# Patient Record
Sex: Female | Born: 1960 | Race: Black or African American | Hispanic: No | Marital: Married | Smoking: Never smoker
Health system: Southern US, Community
[De-identification: ages and names within clinical notes are randomized; demographics above are authoritative.]

## PROBLEM LIST (undated history)

## (undated) DIAGNOSIS — J309 Allergic rhinitis, unspecified: Secondary | ICD-10-CM

## (undated) DIAGNOSIS — I10 Essential (primary) hypertension: Secondary | ICD-10-CM

## (undated) DIAGNOSIS — F32A Depression, unspecified: Secondary | ICD-10-CM

## (undated) DIAGNOSIS — E785 Hyperlipidemia, unspecified: Secondary | ICD-10-CM

## (undated) DIAGNOSIS — Z8669 Personal history of other diseases of the nervous system and sense organs: Secondary | ICD-10-CM

## (undated) DIAGNOSIS — E119 Type 2 diabetes mellitus without complications: Secondary | ICD-10-CM

## (undated) DIAGNOSIS — K219 Gastro-esophageal reflux disease without esophagitis: Secondary | ICD-10-CM

## (undated) HISTORY — PX: ABDOMINAL HYSTERECTOMY: SHX81

## (undated) HISTORY — PX: TUBAL LIGATION: SHX77

## (undated) HISTORY — DX: Allergic rhinitis, unspecified: J30.9

## (undated) HISTORY — PX: ECTOPIC PREGNANCY SURGERY: SHX613

## (undated) HISTORY — DX: Gastro-esophageal reflux disease without esophagitis: K21.9

## (undated) HISTORY — DX: Depression, unspecified: F32.A

## (undated) HISTORY — DX: Personal history of other diseases of the nervous system and sense organs: Z86.69

## (undated) HISTORY — DX: Hyperlipidemia, unspecified: E78.5

---

## 1998-11-28 ENCOUNTER — Emergency Department (HOSPITAL_COMMUNITY): Admission: EM | Admit: 1998-11-28 | Discharge: 1998-11-28 | Payer: Self-pay | Admitting: Emergency Medicine

## 1998-11-28 ENCOUNTER — Encounter: Payer: Self-pay | Admitting: Emergency Medicine

## 1999-05-11 ENCOUNTER — Emergency Department (HOSPITAL_COMMUNITY): Admission: EM | Admit: 1999-05-11 | Discharge: 1999-05-11 | Payer: Self-pay | Admitting: Emergency Medicine

## 1999-08-03 ENCOUNTER — Emergency Department (HOSPITAL_COMMUNITY): Admission: EM | Admit: 1999-08-03 | Discharge: 1999-08-03 | Payer: Self-pay | Admitting: Emergency Medicine

## 1999-08-03 ENCOUNTER — Encounter: Payer: Self-pay | Admitting: Emergency Medicine

## 1999-12-24 ENCOUNTER — Emergency Department (HOSPITAL_COMMUNITY): Admission: EM | Admit: 1999-12-24 | Discharge: 1999-12-24 | Payer: Self-pay | Admitting: Emergency Medicine

## 2000-02-14 ENCOUNTER — Emergency Department (HOSPITAL_COMMUNITY): Admission: EM | Admit: 2000-02-14 | Discharge: 2000-02-14 | Payer: Self-pay | Admitting: Emergency Medicine

## 2000-07-14 ENCOUNTER — Emergency Department (HOSPITAL_COMMUNITY): Admission: EM | Admit: 2000-07-14 | Discharge: 2000-07-14 | Payer: Self-pay | Admitting: Emergency Medicine

## 2000-07-14 ENCOUNTER — Encounter: Payer: Self-pay | Admitting: Emergency Medicine

## 2000-07-22 ENCOUNTER — Encounter: Admission: RE | Admit: 2000-07-22 | Discharge: 2000-08-03 | Payer: Self-pay | Admitting: Orthopaedic Surgery

## 2000-08-17 ENCOUNTER — Encounter: Admission: RE | Admit: 2000-08-17 | Discharge: 2000-09-15 | Payer: Self-pay | Admitting: Orthopaedic Surgery

## 2000-08-21 ENCOUNTER — Encounter: Payer: Self-pay | Admitting: Emergency Medicine

## 2000-08-21 ENCOUNTER — Emergency Department (HOSPITAL_COMMUNITY): Admission: EM | Admit: 2000-08-21 | Discharge: 2000-08-21 | Payer: Self-pay | Admitting: Emergency Medicine

## 2001-10-18 ENCOUNTER — Emergency Department (HOSPITAL_COMMUNITY): Admission: EM | Admit: 2001-10-18 | Discharge: 2001-10-18 | Payer: Self-pay

## 2001-10-20 ENCOUNTER — Emergency Department (HOSPITAL_COMMUNITY): Admission: EM | Admit: 2001-10-20 | Discharge: 2001-10-20 | Payer: Self-pay | Admitting: Emergency Medicine

## 2002-05-10 ENCOUNTER — Emergency Department (HOSPITAL_COMMUNITY): Admission: EM | Admit: 2002-05-10 | Discharge: 2002-05-10 | Payer: Self-pay | Admitting: Emergency Medicine

## 2002-05-10 ENCOUNTER — Encounter: Payer: Self-pay | Admitting: *Deleted

## 2002-10-10 ENCOUNTER — Encounter: Payer: Self-pay | Admitting: Emergency Medicine

## 2002-10-10 ENCOUNTER — Emergency Department (HOSPITAL_COMMUNITY): Admission: EM | Admit: 2002-10-10 | Discharge: 2002-10-10 | Payer: Self-pay | Admitting: Emergency Medicine

## 2002-12-06 ENCOUNTER — Encounter: Admission: RE | Admit: 2002-12-06 | Discharge: 2002-12-06 | Payer: Self-pay | Admitting: Family Medicine

## 2002-12-06 ENCOUNTER — Encounter: Payer: Self-pay | Admitting: Family Medicine

## 2003-02-28 ENCOUNTER — Ambulatory Visit (HOSPITAL_COMMUNITY): Admission: RE | Admit: 2003-02-28 | Discharge: 2003-02-28 | Payer: Self-pay | Admitting: Family Medicine

## 2003-02-28 ENCOUNTER — Encounter: Payer: Self-pay | Admitting: Family Medicine

## 2003-05-28 ENCOUNTER — Emergency Department (HOSPITAL_COMMUNITY): Admission: EM | Admit: 2003-05-28 | Discharge: 2003-05-28 | Payer: Self-pay | Admitting: Emergency Medicine

## 2003-07-21 ENCOUNTER — Emergency Department (HOSPITAL_COMMUNITY): Admission: EM | Admit: 2003-07-21 | Discharge: 2003-07-21 | Payer: Self-pay | Admitting: Emergency Medicine

## 2003-10-25 ENCOUNTER — Emergency Department (HOSPITAL_COMMUNITY): Admission: EM | Admit: 2003-10-25 | Discharge: 2003-10-25 | Payer: Self-pay | Admitting: *Deleted

## 2003-12-04 ENCOUNTER — Emergency Department (HOSPITAL_COMMUNITY): Admission: EM | Admit: 2003-12-04 | Discharge: 2003-12-04 | Payer: Self-pay | Admitting: Emergency Medicine

## 2003-12-24 ENCOUNTER — Emergency Department (HOSPITAL_COMMUNITY): Admission: EM | Admit: 2003-12-24 | Discharge: 2003-12-24 | Payer: Self-pay | Admitting: *Deleted

## 2003-12-26 ENCOUNTER — Emergency Department (HOSPITAL_COMMUNITY): Admission: EM | Admit: 2003-12-26 | Discharge: 2003-12-26 | Payer: Self-pay | Admitting: *Deleted

## 2003-12-27 ENCOUNTER — Inpatient Hospital Stay (HOSPITAL_COMMUNITY): Admission: EM | Admit: 2003-12-27 | Discharge: 2003-12-29 | Payer: Self-pay | Admitting: Emergency Medicine

## 2003-12-28 ENCOUNTER — Encounter (INDEPENDENT_AMBULATORY_CARE_PROVIDER_SITE_OTHER): Payer: Self-pay | Admitting: Cardiology

## 2004-01-01 ENCOUNTER — Emergency Department (HOSPITAL_COMMUNITY): Admission: EM | Admit: 2004-01-01 | Discharge: 2004-01-01 | Payer: Self-pay | Admitting: Family Medicine

## 2004-01-05 ENCOUNTER — Emergency Department (HOSPITAL_COMMUNITY): Admission: EM | Admit: 2004-01-05 | Discharge: 2004-01-05 | Payer: Self-pay | Admitting: Emergency Medicine

## 2004-01-17 ENCOUNTER — Emergency Department (HOSPITAL_COMMUNITY): Admission: EM | Admit: 2004-01-17 | Discharge: 2004-01-18 | Payer: Self-pay | Admitting: Emergency Medicine

## 2004-01-25 ENCOUNTER — Encounter (INDEPENDENT_AMBULATORY_CARE_PROVIDER_SITE_OTHER): Payer: Self-pay | Admitting: Cardiology

## 2004-01-25 ENCOUNTER — Ambulatory Visit (HOSPITAL_COMMUNITY): Admission: RE | Admit: 2004-01-25 | Discharge: 2004-01-25 | Payer: Self-pay | Admitting: Family Medicine

## 2004-04-14 ENCOUNTER — Emergency Department (HOSPITAL_COMMUNITY): Admission: EM | Admit: 2004-04-14 | Discharge: 2004-04-14 | Payer: Self-pay | Admitting: Emergency Medicine

## 2004-07-12 ENCOUNTER — Ambulatory Visit (HOSPITAL_COMMUNITY): Admission: RE | Admit: 2004-07-12 | Discharge: 2004-07-12 | Payer: Self-pay | Admitting: Obstetrics

## 2004-07-17 ENCOUNTER — Ambulatory Visit (HOSPITAL_COMMUNITY): Admission: RE | Admit: 2004-07-17 | Discharge: 2004-07-17 | Payer: Self-pay | Admitting: Obstetrics

## 2004-07-17 ENCOUNTER — Encounter (INDEPENDENT_AMBULATORY_CARE_PROVIDER_SITE_OTHER): Payer: Self-pay | Admitting: *Deleted

## 2004-07-21 ENCOUNTER — Emergency Department (HOSPITAL_COMMUNITY): Admission: EM | Admit: 2004-07-21 | Discharge: 2004-07-21 | Payer: Self-pay | Admitting: Emergency Medicine

## 2004-09-24 ENCOUNTER — Emergency Department (HOSPITAL_COMMUNITY): Admission: EM | Admit: 2004-09-24 | Discharge: 2004-09-24 | Payer: Self-pay | Admitting: Emergency Medicine

## 2004-10-07 HISTORY — PX: ABDOMINAL HYSTERECTOMY: SHX81

## 2005-05-05 ENCOUNTER — Inpatient Hospital Stay (HOSPITAL_COMMUNITY): Admission: EM | Admit: 2005-05-05 | Discharge: 2005-05-07 | Payer: Self-pay | Admitting: Emergency Medicine

## 2005-09-17 ENCOUNTER — Ambulatory Visit (HOSPITAL_COMMUNITY): Admission: RE | Admit: 2005-09-17 | Discharge: 2005-09-17 | Payer: Self-pay | Admitting: Obstetrics

## 2005-09-24 ENCOUNTER — Encounter (INDEPENDENT_AMBULATORY_CARE_PROVIDER_SITE_OTHER): Payer: Self-pay | Admitting: Specialist

## 2005-09-24 ENCOUNTER — Inpatient Hospital Stay (HOSPITAL_COMMUNITY): Admission: RE | Admit: 2005-09-24 | Discharge: 2005-09-26 | Payer: Self-pay | Admitting: Obstetrics

## 2005-10-08 ENCOUNTER — Ambulatory Visit (HOSPITAL_COMMUNITY): Admission: RE | Admit: 2005-10-08 | Discharge: 2005-10-08 | Payer: Self-pay | Admitting: Obstetrics

## 2006-08-12 ENCOUNTER — Ambulatory Visit (HOSPITAL_COMMUNITY): Admission: RE | Admit: 2006-08-12 | Discharge: 2006-08-12 | Payer: Self-pay | Admitting: Family Medicine

## 2006-11-24 IMAGING — US US TRANSVAGINAL NON-OB
1 series · 14 of 25 positions shown · non-contrast
Comparison: 07/12/2004.

CLINICAL DATA: Abdominal uterine bleeding.  
 TRANSABDOMINAL AND TRANSVAGINAL PELVIC ULTRASOUND:
TECHNIQUE: Both transabdominal and transvaginal ultrasound examinations of the pelvis were performed including evaluation of the uterus, ovaries, adnexal regions, and pelvic cul-de-sac.

[Series 1: us transvaginal non-ob · 0.35mm/px · 14 of 60 slices shown]
[im 1/60]
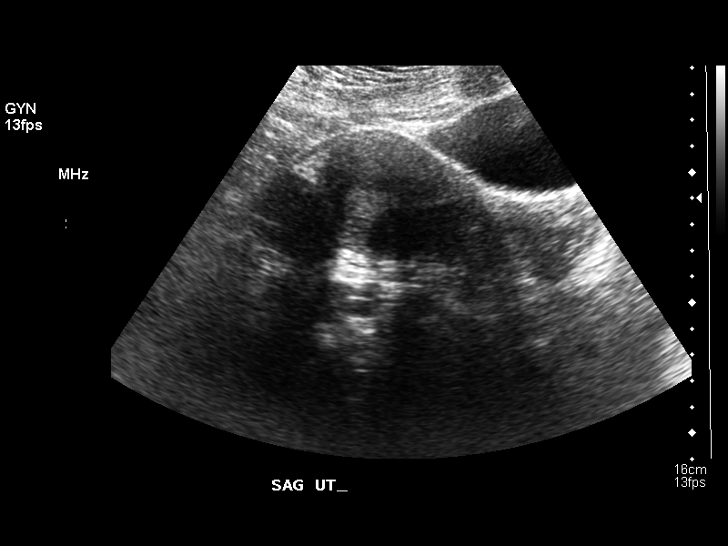
[im 5/60]
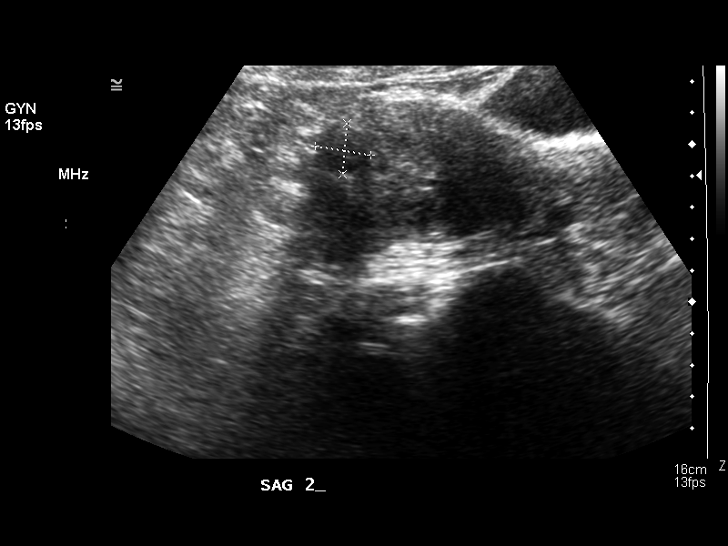
[im 10/60]
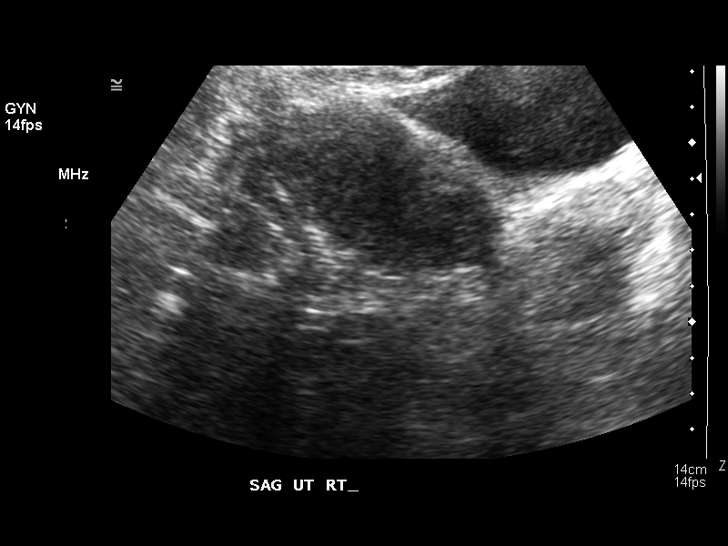
[im 15/60]
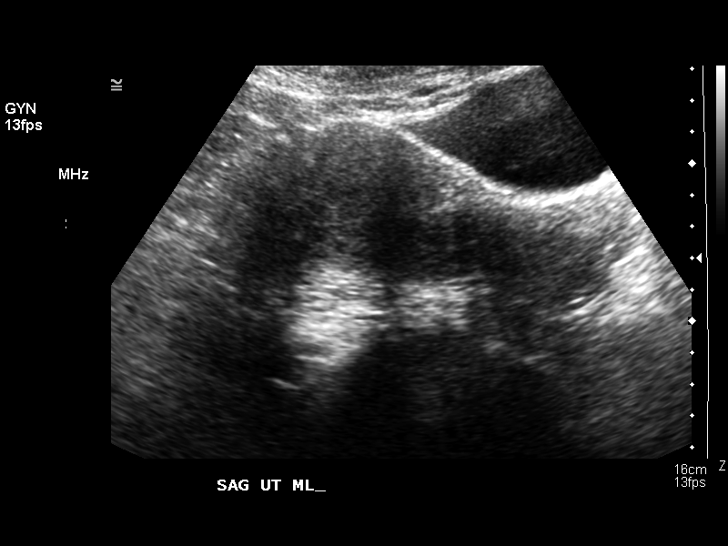
[im 20/60]
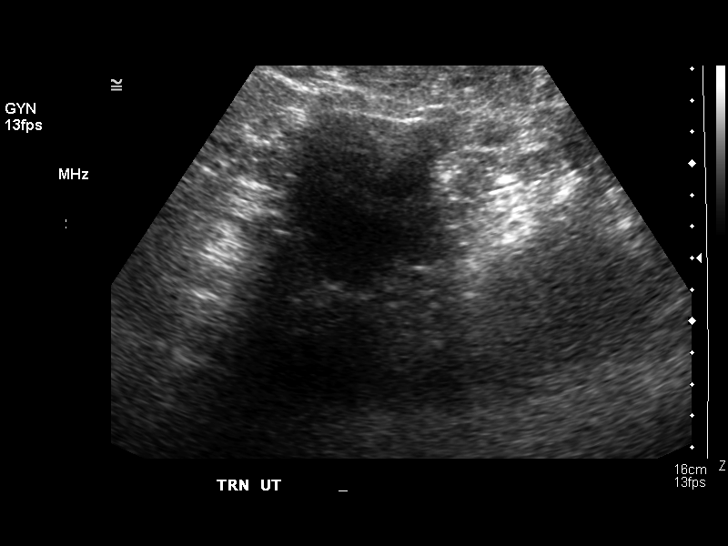
[im 23/60]
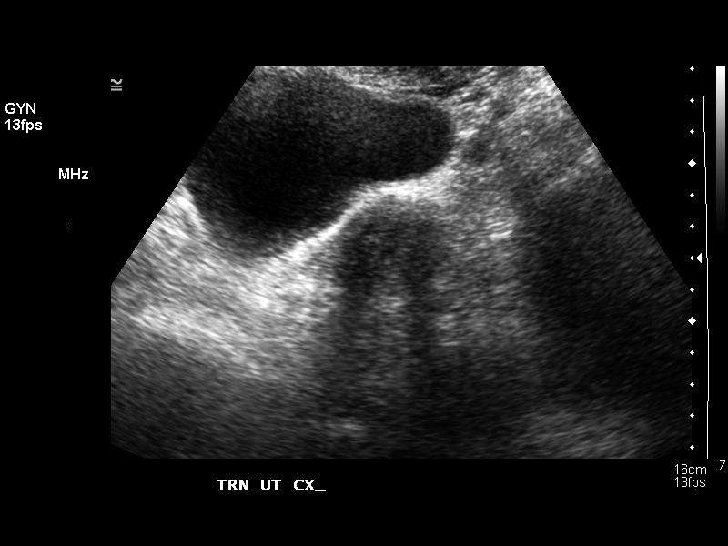
[im 28/60]
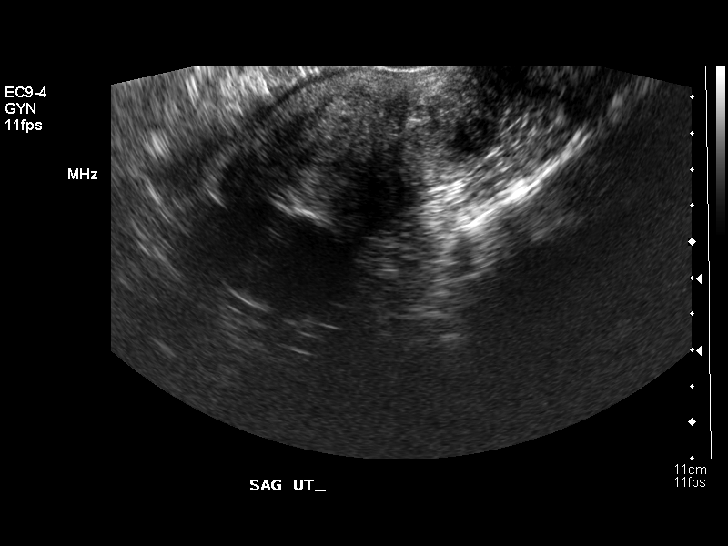
[im 32/60]
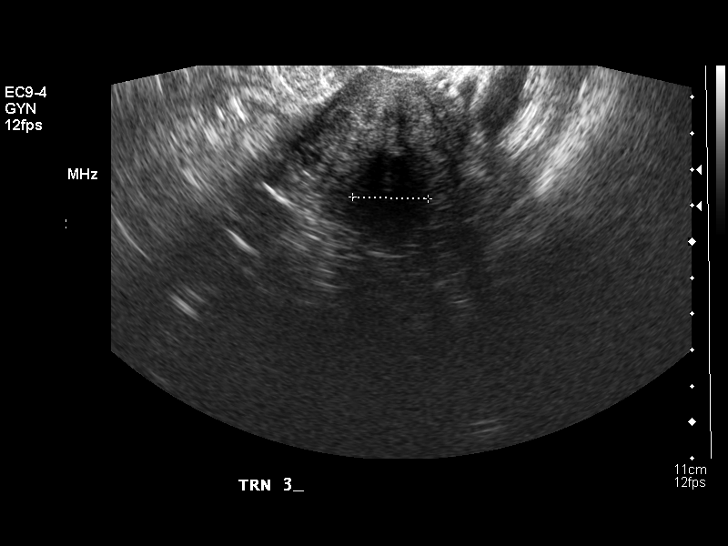
[im 37/60]
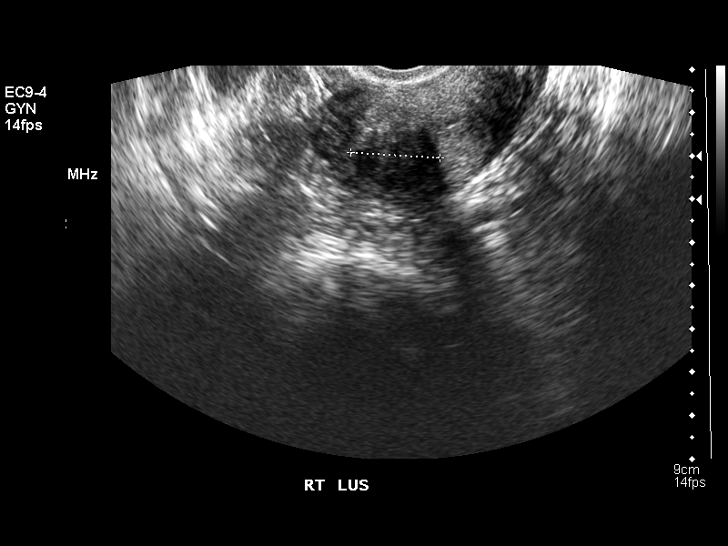
[im 40/60]
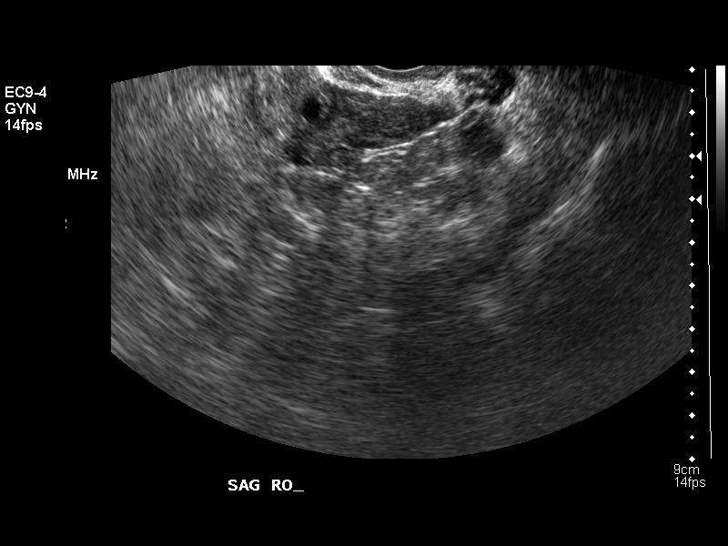
[im 45/60]
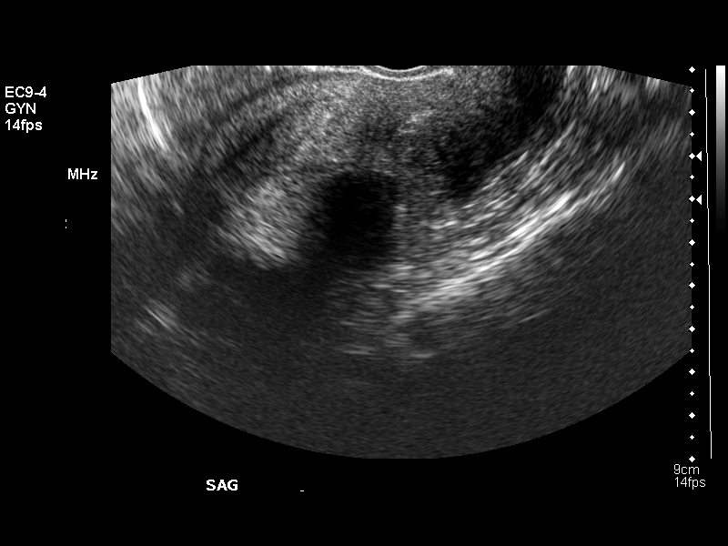
[im 50/60]
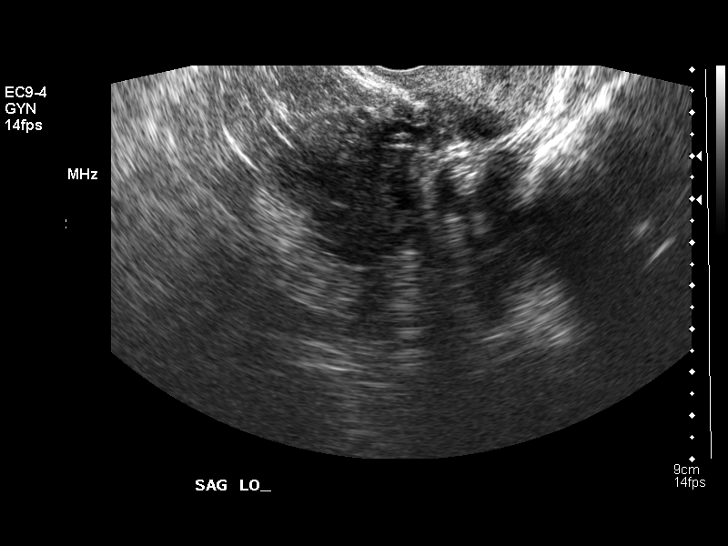
[im 55/60]
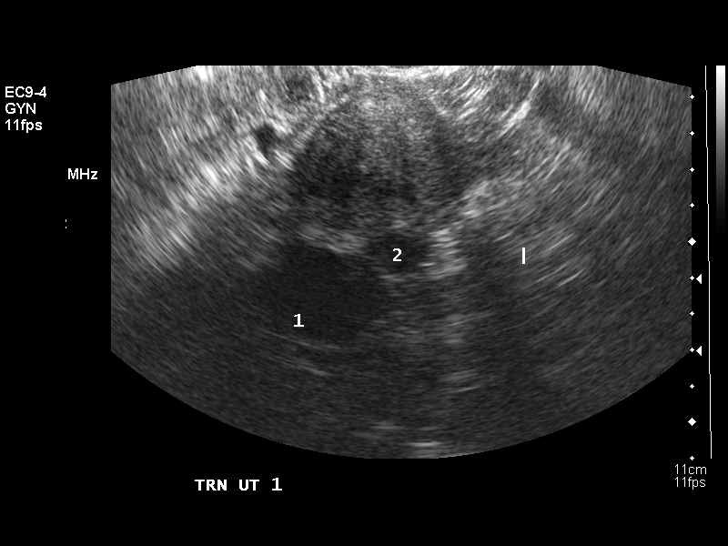
[im 60/60]
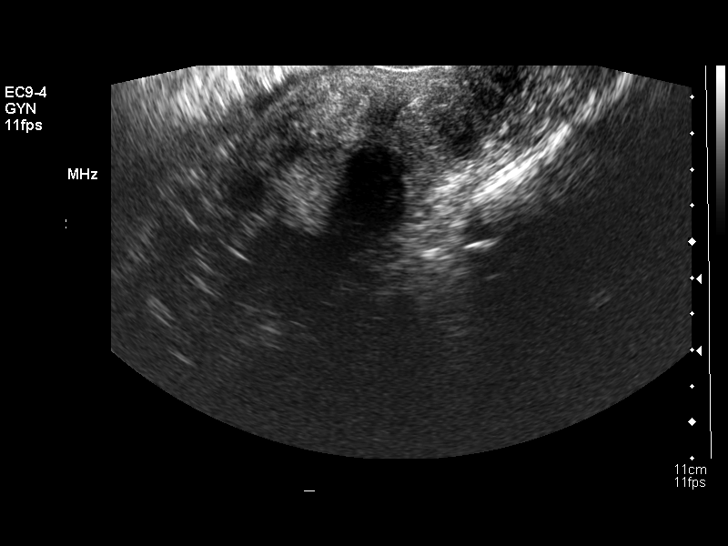

[14 of 25 positions shown; findings below may reference images not displayed]

The uterus is enlarged, measuring approximately 12.2 cm in length x 4.9 cm in AP diameter x 6.6 cm in transverse diameter.  Four fibroids are seen all located within the posterior uterine body and fundus.  These range in size from 2.1 cm to 3.7 cm in greatest diameter.  These are mildly increased in both size and number compared with prior study.  Maximum endometrial thickness measures 8 mm on transvaginal sonography. 
 Both ovaries are visualized and are normal in appearance.  No adnexal masses are identified and there is no evidence of free fluid.
IMPRESSION: 1.  Four posterior uterine fibroids ranging in size from 2.1 cm to 3.6 cm.  These have increased in number and size compared with prior study of 07/12/2004.  
 2.  Normal ovaries.

## 2006-12-09 ENCOUNTER — Ambulatory Visit: Payer: Self-pay | Admitting: Internal Medicine

## 2007-02-10 ENCOUNTER — Encounter: Admission: RE | Admit: 2007-02-10 | Discharge: 2007-02-10 | Payer: Self-pay | Admitting: Family Medicine

## 2007-03-03 ENCOUNTER — Ambulatory Visit: Payer: Self-pay | Admitting: Gastroenterology

## 2007-05-27 ENCOUNTER — Emergency Department (HOSPITAL_COMMUNITY): Admission: EM | Admit: 2007-05-27 | Discharge: 2007-05-27 | Payer: Self-pay | Admitting: Emergency Medicine

## 2007-07-01 ENCOUNTER — Ambulatory Visit (HOSPITAL_COMMUNITY): Payer: Self-pay | Admitting: Psychiatry

## 2007-07-28 ENCOUNTER — Ambulatory Visit (HOSPITAL_COMMUNITY): Payer: Self-pay | Admitting: Psychiatry

## 2007-09-01 ENCOUNTER — Ambulatory Visit (HOSPITAL_COMMUNITY): Payer: Self-pay | Admitting: Psychiatry

## 2007-09-02 ENCOUNTER — Emergency Department (HOSPITAL_COMMUNITY): Admission: EM | Admit: 2007-09-02 | Discharge: 2007-09-02 | Payer: Self-pay | Admitting: Emergency Medicine

## 2007-11-17 ENCOUNTER — Ambulatory Visit (HOSPITAL_COMMUNITY): Payer: Self-pay | Admitting: Psychiatry

## 2010-06-29 ENCOUNTER — Encounter: Payer: Self-pay | Admitting: Obstetrics

## 2010-06-30 ENCOUNTER — Encounter: Payer: Self-pay | Admitting: Obstetrics

## 2010-10-25 NOTE — Op Note (Signed)
NAME:  Sherry Cook, Sherry Cook NO.:  [id number]   MEDICAL RECORD NO.:  [id number]          PATIENT TYPE:  AMB   LOCATION:  SDC                           FACILITY:  [facility]   PHYSICIAN:  Kathreen Cosier, M.D.DATE OF BIRTH:  January 08, 1961   DATE OF PROCEDURE:  07/17/2004  DATE OF DISCHARGE:                                 OPERATIVE REPORT   PREOPERATIVE DIAGNOSES:  Dysfunctional uterine bleeding.   Under general anesthesia, perineum and vagina prepped and draped. The  bladder emptied with a straight catheter. Bimanual exam revealed the uterus  to be top normal size with small myomas.  Prior to the bimanual, the  Laminaria was removed from the cervix.  The anterior lip of the cervix  grasped with a tenaculum and the cervix curetted and a small amount of  tissue obtained.  The endometrial cavity sounded to 10 cm, endocervical  length measured to __________ cm giving a cavity length of 4 cm.  The cervix  was already noted to be dilated to a #25 Shawnie Pons. A diagnostic hysteroscope  was inserted. The cavity was normal and then sharp curettage performed. A  large amount of tissue obtained. The Novasure device was inserted in the  endometrial cavity and the cavity integrity test performed. The cavity width  was 2.5 cm.  Ablation was performed at 55 watts for 1.23 minutes and then  the hysteroscopy was repeated and the cavity was noted to be totally  ablated. Fluid deficit was 40 mL.  The patient tolerated the procedure well  and was taken to the recovery room in good condition.      BAM/MEDQ  D:  07/17/2004  T:  07/17/2004  Job:  [id number]

## 2010-10-25 NOTE — Op Note (Signed)
NAME:  Sherry Cook, Sherry Cook                ACCOUNT NO.:  [id number]   MEDICAL RECORD NO.:  [id number]          PATIENT TYPE:  INP   LOCATION:  9312                          FACILITY:  [facility]   PHYSICIAN:  Kathreen Cosier, M.D.DATE OF BIRTH:  June 30, 1960   DATE OF PROCEDURE:  09/24/2005  DATE OF DISCHARGE:                                 OPERATIVE REPORT   PREOPERATIVE DIAGNOSIS:  Myoma uteri, chronic pelvic pain.   POSTOPERATIVE DIAGNOSIS:   OPERATION PERFORMED:   SURGEON:  Kathreen Cosier, M.D.   ASSISTANT:  Charles A. Clearance Coots, M.D.   ANESTHESIA:  General.   DESCRIPTION OF PROCEDURE:  Patient placed on the operating table in the  supine position. General anesthesia administered by Dr. Jean Rosenthal. Abdomen  prepped and draped.  Bladder emptied with Foley catheter.  Transverse  suprapubic incision made through old scar and carried down to rectus fascia.  Fasciaincised the length of the incision.  Rectus muscles were retracted  laterally.  Peritoneum incised longitudinally.  She was noted to have  multiple myomas.  The ovaries were normal.  The right tube was absent from a  previous ectopic.  Right round ligament was grasped with a Kelly clamp, cut,  suture ligated with #1 chromic.  Procedure done in a similar fashion on the  other side.  Metzenbaum scissors were used to develop the bladder flap and  the bladder dissected off of the cervix.  The right utero-ovarian ligament  was grasped with a Kelly clamp, cut, suture ligated with a #1 chromic.  Procedure done in similar fashion to the other side.  Uterine vessels  skeletonized, double clamped with Heaney clamps on the right, cut and suture  ligated x 2 with #1 chromic.  Procedure done in similar fashion to the other  side.  The cardinal and uterosacral ligaments grasped with straight Kocher  clamp on the right, cut, suture ligated with #1 chromic, similar fashion to  the other side.  The uterus was removed at the cervicovaginal junction  with  Mayo scissors.  Modified Richardson's sutures placed in the angles of the  vagina and then the vaginal vault run with interlocking suture of #1  chromic.  Vault was left open.  Hemostasis was satisfactory.  The operative  site was reperitonealized with 2-0 chromic.  Lap and sponge counts correct.  Abdomen closed in layers.  Peritoneum continuous suture of 0 chromic, fascia  continuous suture of 0 Dexon, and the skin closed with subcuticular stitch  of 4-0 Monocryl.  Blood loss was less than 100 mL.  The patient tolerated  the procedure well, taken to recovery room in good condition.           ______________________________  Kathreen Cosier, M.D.     BAM/MEDQ  D:  09/24/2005  T:  09/25/2005  Job:  [id number]

## 2010-10-25 NOTE — H&P (Signed)
NAME:  Sherry Cook, Sherry Cook                          ACCOUNT NO.:  [id number]   MEDICAL RECORD NO.:  [id number]                   PATIENT TYPE:  INP   LOCATION:  3711                                 FACILITY:  [facility]   PHYSICIAN:  Hettie Holstein, D.O.                 DATE OF BIRTH:  1960-10-10   DATE OF ADMISSION:  12/27/2003  DATE OF DISCHARGE:                                HISTORY & PHYSICAL   HISTORY OF PRESENT ILLNESS:  This is a pleasant 49 year old African American  female with a history of hypertension on medications for the past two years  who has seen Dr. Ronne Binning just six weeks ago with complaints of generalized  fatigue and weight gain who, out of frustration, quit her medications and  has been in the emergency department three times over the past month, each  with poorly controlled hypertension.  Back on June 27, she had presenting  blood pressure of 188/122.  On July 17, she presented to the Urgent [facility] and had blood pressure 196/125.  Today, she presented with a blood  pressure of 178/112.  She states she has not taken her medications for the  past six weeks secondary to feeling that they were contributing to her poor  energy and feeling tired all the time.  Today, she had a headache, frontal  and posterior, that was throbbing.  Again, she was noted to have elevated  blood pressure.  The emergency department sent her to CT scan and there was  no evidence of acute bleed.  She was given small doses of Clonidine and  minimal doses of Labetalol without resolution of her hypertensive urgency.  She had no other focal complaints.  She had some blurred vision and nausea  associated with her headache and this was not relieved with narcotics in the  emergency department.  We were called down to evaluate Sherry Cook for medical  management of her hypertensive urgency.   PAST MEDICAL HISTORY:  Significant for hypertension over the past two years.  She has a history of diabetes  mellitus that has been quite labile.  She has  been on medications in the past, Glucovance, and has experienced  hypoglycemic episodes multiple times and has changed to Actos.  She has a  history of ectopic pregnancy in the past.  Tubal ligation.  She has two  children.  No prior history of heart disease.  No prior history of heart  evaluation.  No prior history of kidney or liver disease to her knowledge.  She retains her gallbladder and appendix as well as her uterus.   MEDICATIONS GIVEN:  She has not been on any medications for the past six  weeks.  She had been on Actos, Lotrel, and Toprol, but she does not know the  dosages.   ALLERGIES:  She has no known drug allergies.   SOCIAL HISTORY:  She does  not smoke tobacco or use alcohol.  She is a Lawyer  and works with the elderly.  She lives with her husband.   FAMILY HISTORY:  The patient states that her mother is healthy, her father  had heart disease at a young age, she has siblings with Wolf-Parkinson-White  syndrome.   REVIEW OF SYMPTOMS:  She has had some labile weights ranging from 173 to  216.  She states she has initiated an exercise program and she reports some  nausea with this episode of headache.  She denies chest pain, shortness of  breath, dysuria, bowel irregularity, hematemesis, hematochezia, lower  extremity swelling.  She does not have any dyspnea on exertion or PND.   PHYSICAL EXAMINATION:  VITAL SIGNS:  Blood pressure 158/109, heart rate 90, respirations 18,  temperature 97.5, O2 saturation 99% on room air.  GENERAL:  She appears alert, oriented, in no acute distress.  She exhibits  some photophobia and discomfort.  NECK:  Supple, nontender, no palpable thyromegaly.  CHEST:  Clear to auscultation bilaterally.  HEART:  Normal S1 and S2 without murmur.  ABDOMEN:  Soft and nontender, no palpable mass.  EXTREMITIES:  No edema.  There are peripheral pulses, her extremities are  cold, capillary refill less than 3  seconds.   LABORATORY DATA:  Labs in the emergency department revealed a hemoglobin  16.7, sodium 132, potassium 4, creatinine 0.4, all obtained STAT in the  emergency department.  Her urine drug screen was only positive for opiates  administered by [place].  Urinalysis from three days ago revealed only 2 RBCs and  2 WBCs.  Her urinalysis was, overall, unremarkable.  CT scan of her head as  noted above revealed no evidence of acute findings.  EKG revealed normal  sinus rhythm with biatrial enlargement and LVH as well as a prolonged QT.   IMPRESSION:  1. Hypertensive urgency.  2. Headache.  3. Diabetes mellitus.  4. Obesity.  5. Easy fatigability.  6. Abnormal EKG with LVH.   PLAN:  We are going to admit Sherry Cook for treatment of her hypertensive  urgency as well as reinitiate her oral anti-hypertensive medications, check  fasting lipid profile, hemoglobin A1C to assess diabetic control.  In light  of her abnormal EKG it may be reasonable to check a 2D echocardiogram and  follow on telemetry for the next 23 hours.  In light of her history of  hypoglycemic events, will start her back on Glucophage only and we are going  to place her on sliding scale, administer antiemetics, and place her on  Oxycodone and Tylenol for pain.  If she is feeling better over the next 24  hours, it is possible that she may complete the rest of her workup by her  primary care physician.                                                Hettie Holstein, D.O.    ESS/MEDQ  D:  12/27/2003  T:  12/27/2003  Job:  [id number]   cc:   Lorelle Formosa, M.D.  [phone] [address]. [address]  [place]  [place] [address]  Fax: [phone]

## 2010-10-25 NOTE — Discharge Summary (Signed)
NAME:  Sherry Cook, Sherry Cook                          ACCOUNT NO.:  [id number]   MEDICAL RECORD NO.:  [id number]                   PATIENT TYPE:  INP   LOCATION:  3711                                 FACILITY:  [facility]   PHYSICIAN:  Jonna L. Robb Matar, M.D.            DATE OF BIRTH:  1961-04-21   DATE OF ADMISSION:  12/27/2003  DATE OF DISCHARGE:  12/29/2003                                 DISCHARGE SUMMARY   PRIMARY CARE PHYSICIAN:  Renaye Rakers, MD.   FINAL DIAGNOSES:  1. Malignant hypertension.  2. Hypertensive headache.  3. Type 2 diabetes.  4. Left ventricular hypertrophy.  5. Chronic constipation.  6. Bleeding internal hemorrhoids.   ALLERGIES:  None.   CODE STATUS:  Full.   HISTORY:  This 50 year old African-American female has a long history of  hypertension and diabetes.  She gained a lot of weight on Actos, states she  stopped that, and she has had chronic problems of being tired from her blood  pressure medications, and so six weeks ago she just stopped those as well.  She has a family history of Wolfe-Parkinson-White syndrome.  Physical exam  on admission showed a blood pressure of  158/109.  She had photophobia.  Normal heart sounds.  Sodium 132.  CT of the head was normal.  EKG showed  some biatrial enlargement, LVH, and prolonged QT.   HOSPITAL COURSE:  The patient was evaluated.  Enzymes were negative.  Echocardiogram showed some very minimal LVH, but was otherwise normal.  Sodium is down to 128.  Sugars ranged in the 170s.  A1c was 6.0.  TSH was  normal.   The patient was treated with sliding scale insulin, started on Metformin,  which she tolerated well, IV Labetalol, Lotrel, aspirin, and Clonidine.  Simplifying her medications, we dc 'd Lotensin and the Clonidine and  switched her to Avapro 300 since that has been shown to give better results  in African-Americans.  I increased her Norvasc to 10, kept her on a low dose  of the Labetalol 100 b.i.d., and the  following morning of discharge, her  blood pressure was 110/80, her headache had completely cleared.  She had  some problems with bloating, constipation, and a little bit of bright red  bleeding per rectum.  She has had these frequently in the past, and she has  chronic constipation.   DISPOSITION:  The patient is going to be discharged on Labetalol 100 b.i.d.,  Norvasc 10 daily, Avapro 300 daily.  She has some hydrochlorothiazide at  home and I told her to use a half a pill only on a p.r.n. basis for fluid.  She will take an aspirin 81 daily, Metformin 500 b.i.d.  I have told her  that if her sugars start getting below 80 or 90 rather  than stopping the medicine, she can either go to 500 daily or take 250  b.i.d.  She is to  use Colace 100 b.i.d. on a routine basis, and prune juice  or greens or other high fiber foods.  She is very good about staying on a  low salt diet.  She will have an appointment with Dr. Parke Simmers in one to two  weeks.                                                Jonna L. Robb Matar, M.D.    Dorna Bloom  D:  12/29/2003  T:  12/30/2003  Job:  [id number]   cc:   Renaye Rakers, M.D.  [phone] [address], [address]  [place]  [place] [address]  Fax: [phone]

## 2010-10-25 NOTE — Discharge Summary (Signed)
NAME:  MALON, SIDDALL                ACCOUNT NO.:  [id number]   MEDICAL RECORD NO.:  [id number]          PATIENT TYPE:  INP   LOCATION:  9312                          [facility]ACILITY[facility]   PHYSICIAN:  Kathreen Cosier, M.D.DATE OF BIRTH:  03-15-1961   DATE OF ADMISSION:  09/24/2005  DATE OF DISCHARGE:  09/26/2005                                 DISCHARGE SUMMARY   HISTORY OF PRESENT ILLNESS:  The patient is a 50 year old gravida 3, para 2-  0-1-2 with a history of heavy periods with a lot of dysmenorrhea and history  of myomas.  The patient had a NovaSure ablation in February of 2006 and  temporarily got better, but the problem because more severe.   On admission, her hemoglobin was 12.7, platelets 236, white count 6000.  PT  of 13.2, INR 1, PTT 29.  Sodium 134, potassium 3.5, chloride 98.  Urinalysis  was negative.  The patient underwent a TAH and a left salpingectomy.  Estimated blood loss was less than 100 cc.   Postoperatively, she did well.  She was discharged on the second  postoperative day, ambulatory on a regular diet.  On Tylox for pain.  She is  to see me in 4 weeks.   DISCHARGE DIAGNOSES:  Status post total abdominal hysterectomy, left  salpingectomy for leiomyomata uteri.           ______________________________  Kathreen Cosier, M.D.     BAM/MEDQ  D:  10/15/2005  T:  10/16/2005  Job:  [id number]

## 2011-03-14 LAB — DIFFERENTIAL
Basophils Absolute: 0
Basophils Relative: 0
Eosinophils Absolute: 0.1 — ABNORMAL LOW
Eosinophils Relative: 2
Lymphs Abs: 2.1
Monocytes Absolute: 0.1
Monocytes Relative: 2 — ABNORMAL LOW
Neutrophils Relative %: 64

## 2011-03-14 LAB — POCT CARDIAC MARKERS
CKMB, poc: 1.1
CKMB, poc: <1 — ABNORMAL LOW
Myoglobin, poc: 61
Operator id: 4001
Operator id: 4001
Troponin i, poc: <0.05
Troponin i, poc: <0.05

## 2011-03-14 LAB — CBC
HCT: 38.3
RBC: 4.66

## 2011-03-14 LAB — COMPREHENSIVE METABOLIC PANEL
AST: 21
Albumin: 3.4 — ABNORMAL LOW
BUN: 3 — ABNORMAL LOW
Calcium: 9.2
GFR calc Af Amer: >60
Total Protein: 6.9

## 2011-03-14 LAB — LIPASE, BLOOD: Lipase: 30

## 2015-04-04 ENCOUNTER — Emergency Department (INDEPENDENT_AMBULATORY_CARE_PROVIDER_SITE_OTHER)
Admission: EM | Admit: 2015-04-04 | Discharge: 2015-04-04 | Disposition: A | Discharge status: Home / Self Care | Payer: No Typology Code available for payment source | Source: Home / Self Care | Attending: Family Medicine | Admitting: Family Medicine

## 2015-04-04 ENCOUNTER — Encounter (HOSPITAL_COMMUNITY): Payer: Self-pay | Admitting: Emergency Medicine

## 2015-04-04 DIAGNOSIS — G44209 Tension-type headache, unspecified, not intractable: Secondary | ICD-10-CM

## 2015-04-04 DIAGNOSIS — Z91048 Other nonmedicinal substance allergy status: Secondary | ICD-10-CM

## 2015-04-04 DIAGNOSIS — Z9109 Other allergy status, other than to drugs and biological substances: Secondary | ICD-10-CM

## 2015-04-04 DIAGNOSIS — E118 Type 2 diabetes mellitus with unspecified complications: Secondary | ICD-10-CM

## 2015-04-04 HISTORY — DX: Essential (primary) hypertension: I10

## 2015-04-04 HISTORY — DX: Type 2 diabetes mellitus without complications: E11.9

## 2015-04-04 LAB — POCT I-STAT, CHEM 8
BUN: 5 mg/dL — ABNORMAL LOW (ref 6–20)
CREATININE: 0.5 mg/dL (ref 0.44–1.00)
Calcium, Ion: 1.22 mmol/L (ref 1.12–1.23)
Chloride: 98 mmol/L — ABNORMAL LOW (ref 101–111)
GLUCOSE: 302 mg/dL — AB (ref 65–99)
HEMATOCRIT: 45 % (ref 36.0–46.0)
HEMOGLOBIN: 15.3 g/dL — AB (ref 12.0–15.0)
POTASSIUM: 3.5 mmol/L (ref 3.5–5.1)
Sodium: 138 mmol/L (ref 135–145)
TCO2: 26 mmol/L (ref 0–100)

## 2015-04-04 MED ORDER — LISINOPRIL-HYDROCHLOROTHIAZIDE 20-12.5 MG PO TABS: 1.0000 | ORAL_TABLET | Freq: Two times a day (BID) | ORAL | Status: DC

## 2015-04-04 MED ORDER — AMLODIPINE BESYLATE 5 MG PO TABS: 5.0000 mg | ORAL_TABLET | Freq: Every day | ORAL | Status: DC

## 2015-04-04 MED ORDER — METFORMIN HCL 1000 MG PO TABS
1000.0000 mg | ORAL_TABLET | Freq: Two times a day (BID) | ORAL | Status: DC
Start: 2015-04-04 — End: 2022-08-18

## 2015-04-04 MED ORDER — CARVEDILOL 25 MG PO TABS
25.0000 mg | ORAL_TABLET | Freq: Two times a day (BID) | ORAL | Status: AC
Start: 2015-04-04 — End: ?

## 2015-04-04 MED ORDER — OMEPRAZOLE 40 MG PO CPDR: 40.0000 mg | DELAYED_RELEASE_CAPSULE | Freq: Every day | ORAL | Status: DC

## 2015-04-04 MED ORDER — IPRATROPIUM BROMIDE 0.06 % NA SOLN: 2.0000 | Freq: Four times a day (QID) | NASAL | Status: DC

## 2015-04-04 NOTE — ED Provider Notes (Signed)
CSN: [id number]     Arrival date & time 04/04/15  1536 History   First MD Initiated Contact with Patient 04/04/15 1628     Chief Complaint  Patient presents with  . Headache  . Medication Refill   (Consider location/radiation/quality/duration/timing/severity/associated sxs/prior Treatment) HPI  HA. Started 4 wks ago. Posterior L side of head. Neck stiffness. Constant. Worse w/ certain neck movements. H/o Migraines but this feels different per pt. Started after a cold. Tried. Tylenol migraine w/ temporary relief.   Typical allergies. Intermittent nasal stuffiness.    Constant blurry vision. Waxes and wanes. Ongoing since stopping diabetes medicines. Denies any other focal abnormality, palpations, chest pain, short of breath, fevers, nausea, rash, muscle weakness.  Does not check home blood glucose.      Past Medical History  Diagnosis Date  . Diabetes mellitus without complication (HCC)   . Hypertension    Past Surgical History  Procedure Laterality Date  . Abdominal hysterectomy    . Tubal ligation    . Ectopic pregnancy surgery     History reviewed. No pertinent family history. Social History  Substance Use Topics  . Smoking status: Never Smoker   . Smokeless tobacco: None  . Alcohol Use: No   OB History    No data available     Review of Systems Per HPI with all other pertinent systems negative.   Allergies  Ibuprofen  Home Medications   Prior to Admission medications   Medication Sig Start Date End Date Taking? Authorizing Provider  amLODipine (NORVASC) 5 MG tablet Take 1 tablet (5 mg total) by mouth daily. 04/04/15   Ozella Rocks, MD  carvedilol (COREG) 25 MG tablet Take 1 tablet (25 mg total) by mouth 2 (two) times daily with a meal. 04/04/15   Ozella Rocks, MD  ipratropium (ATROVENT) 0.06 % nasal spray Place 2 sprays into both nostrils 4 (four) times daily. 04/04/15   Ozella Rocks, MD  lisinopril-hydrochlorothiazide (PRINZIDE,ZESTORETIC)  20-12.5 MG tablet Take 1 tablet by mouth 2 (two) times daily. 04/04/15   Ozella Rocks, MD  metFORMIN (GLUCOPHAGE) 1000 MG tablet Take 1 tablet (1,000 mg total) by mouth 2 (two) times daily with a meal. 04/04/15   Ozella Rocks, MD  omeprazole (PRILOSEC) 40 MG capsule Take 1 capsule (40 mg total) by mouth daily. 04/04/15   Ozella Rocks, MD   Meds Ordered and Administered this Visit  Medications - No data to display  BP 157/88 mmHg  Pulse 86  Temp(Src) 98.5 F (36.9 C) (Oral)  SpO2 100% No data found.   Physical Exam Physical Exam  Constitutional: oriented to person, place, and time. appears well-developed and well-nourished. No distress.  HENT:  Head: Normocephalic and atraumatic.  Eyes: EOMI. PERRL.  Neck: Normal range of motion.  Cardiovascular: RRR, no m/r/g, 2+ distal pulses,  Pulmonary/Chest: Effort normal and breath sounds normal. No respiratory distress.  Abdominal: Soft. Bowel sounds are normal. NonTTP, no distension.  Musculoskeletal: Normal range of motion. Non ttp, no effusion.  Neurological: alert and oriented to person, place, and time.  Skin: Skin is warm. No rash noted. non diaphoretic.  Psychiatric: normal mood and affect. behavior is normal. Judgment and thought content normal.   ED Course  Procedures (including critical care time)  Labs Review Labs Reviewed  POCT I-STAT, CHEM 8 - Abnormal; Notable for the following:    Chloride 98 (*)    BUN 5 (*)    Glucose, Bld 302 (*)  Hemoglobin 15.3 (*)    All other components within normal limits    Imaging Review No results found.   Visual Acuity Review  Right Eye Distance:   Left Eye Distance:   Bilateral Distance:    Right Eye Near:   Left Eye Near:    Bilateral Near:         MDM   1. Diabetes mellitus with complication (HCC)   2. Muscle tension headache   3. Environmental allergies   Refill metformin. MH showing hyperglycemia to the level of 302., Regional blood pressure  medications. Chemistry showing normal renal function and metabolic status. Patient tachycardic likely secondary to muscle tension. Patient very averse to taking muscle relaxers that she states she's taken multiple in the past and they make her feel funny. Recommend patient use NSAIDs and heat and massage. Tylenol for additional relief. The site appears the patient's complaint of intermittent blurry vision is likely due to hypoglycemia causing viscous changes in the eyes. Patient with emergency room if this ever comes on and remains constant..  Nasal atrovent, flonase, nsaids, zyrtec for allergies   Ozella Rocksavid J Merrell, MD 04/04/15 [id number]

## 2015-04-04 NOTE — ED Notes (Signed)
Pt has been having a headache off and on for about 4 weeks.  [place] does not have [facility] December.  She has been out of her Lisinopril and Metformin for about a month and her Carvedilol, Amlodipine, and Omeprazole for 2 weeks.  Pt has not checked her BS since the end of July.

## 2015-04-04 NOTE — ED Notes (Signed)
Checked on pt and let her know we were waiting on her I-STAT results.  Pt states she is doing fine.

## 2015-04-04 NOTE — Discharge Instructions (Signed)
Your blood pressure medications and diabetes medications have been refilled today. Please follow-up with your physician at your new appointment. Please use Tylenol 1000 mg every 8 hours and ibuprofen 600 mg every 6 hours for your headache and muscle tension. Please apply heat packs to your upper left back and shoulder and neck. Please go to emergency room if worse. Her blurry vision should improve as you're sugar numbers improved.  Please use nasal Atrovent for nasal congestion, Flonase every night before bed, an nightly allergy medicine such as Zyrtec or Allegra, and nasal saline 2-4 times per day. All of these medications are safe given your high blood pressure and will help significantly with her allergies.

## 2015-05-30 ENCOUNTER — Emergency Department (HOSPITAL_COMMUNITY): Payer: No Typology Code available for payment source

## 2015-05-30 ENCOUNTER — Encounter (HOSPITAL_COMMUNITY): Payer: Self-pay | Admitting: Emergency Medicine

## 2015-05-30 ENCOUNTER — Emergency Department (HOSPITAL_COMMUNITY)
Admission: EM | Admit: 2015-05-30 | Discharge: 2015-05-30 | Disposition: A | Discharge status: Home / Self Care | Payer: No Typology Code available for payment source | Attending: Emergency Medicine | Admitting: Emergency Medicine

## 2015-05-30 DIAGNOSIS — I10 Essential (primary) hypertension: Secondary | ICD-10-CM | POA: Insufficient documentation

## 2015-05-30 DIAGNOSIS — R197 Diarrhea, unspecified: Secondary | ICD-10-CM | POA: Insufficient documentation

## 2015-05-30 DIAGNOSIS — E119 Type 2 diabetes mellitus without complications: Secondary | ICD-10-CM | POA: Insufficient documentation

## 2015-05-30 DIAGNOSIS — H9202 Otalgia, left ear: Secondary | ICD-10-CM | POA: Insufficient documentation

## 2015-05-30 DIAGNOSIS — Z7984 Long term (current) use of oral hypoglycemic drugs: Secondary | ICD-10-CM | POA: Insufficient documentation

## 2015-05-30 DIAGNOSIS — J209 Acute bronchitis, unspecified: Secondary | ICD-10-CM | POA: Insufficient documentation

## 2015-05-30 DIAGNOSIS — Z79899 Other long term (current) drug therapy: Secondary | ICD-10-CM | POA: Insufficient documentation

## 2015-05-30 MED ORDER — ALBUTEROL SULFATE HFA 108 (90 BASE) MCG/ACT IN AERS
2.0000 | INHALATION_SPRAY | RESPIRATORY_TRACT | Status: DC
  Administered 2015-05-30: 2 via RESPIRATORY_TRACT
  Filled 2015-05-30: qty 6.7

## 2015-05-30 MED ORDER — ALBUTEROL SULFATE (2.5 MG/3ML) 0.083% IN NEBU
5.0000 mg | INHALATION_SOLUTION | Freq: Once | RESPIRATORY_TRACT | Status: AC
  Administered 2015-05-30: 5 mg via RESPIRATORY_TRACT
  Filled 2015-05-30: qty 6

## 2015-05-30 MED ORDER — AZITHROMYCIN 250 MG PO TABS: 250.0000 mg | ORAL_TABLET | Freq: Every day | ORAL | Status: DC

## 2015-05-30 MED ORDER — IPRATROPIUM BROMIDE 0.02 % IN SOLN
0.5000 mg | Freq: Once | RESPIRATORY_TRACT | Status: AC
  Administered 2015-05-30: 0.5 mg via RESPIRATORY_TRACT
  Filled 2015-05-30: qty 2.5

## 2015-05-30 MED ORDER — PREDNISONE 20 MG PO TABS
60.0000 mg | ORAL_TABLET | Freq: Once | ORAL | Status: AC
  Administered 2015-05-30: 60 mg via ORAL
  Filled 2015-05-30: qty 3

## 2015-05-30 MED ORDER — ACETAMINOPHEN 500 MG PO TABS
1000.0000 mg | ORAL_TABLET | Freq: Once | ORAL | Status: AC
Start: 2015-05-30 — End: 2015-05-30
  Administered 2015-05-30: 1000 mg via ORAL
  Filled 2015-05-30: qty 2

## 2015-05-30 NOTE — ED Notes (Addendum)
Patient presents for non productive cough x1 month; watery, soft diarrhea, mid upper abdominal pain, sore throat, posterior HA x1 day. Rates pain 3/10.

## 2015-05-30 NOTE — Discharge Instructions (Signed)
Bronchospasm, Adult  A bronchospasm is a spasm or tightening of the airways going into the lungs. During a bronchospasm breathing becomes more difficult because the airways get smaller. When this happens there can be coughing, a whistling sound when breathing (wheezing), and difficulty breathing. Bronchospasm is often associated with asthma, but not all patients who experience a bronchospasm have asthma.  CAUSES   A bronchospasm is caused by inflammation or irritation of the airways. The inflammation or irritation may be triggered by:   · Allergies (such as to animals, pollen, food, or mold). Allergens that cause bronchospasm may cause wheezing immediately after exposure or many hours later.    · Infection. Viral infections are believed to be the most common cause of bronchospasm.    · Exercise.    · Irritants (such as pollution, cigarette smoke, strong odors, aerosol sprays, and paint fumes).    · Weather changes. Winds increase molds and pollens in the air. Rain refreshes the air by washing irritants out. Cold air may cause inflammation.    · Stress and emotional upset.    SIGNS AND SYMPTOMS   · Wheezing.    · Excessive nighttime coughing.    · Frequent or severe coughing with a simple cold.    · Chest tightness.    · Shortness of breath.    DIAGNOSIS   Bronchospasm is usually diagnosed through a history and physical exam. Tests, such as chest X-rays, are sometimes done to look for other conditions.  TREATMENT   · Inhaled medicines can be given to open up your airways and help you breathe. The medicines can be given using either an inhaler or a nebulizer machine.  · Corticosteroid medicines may be given for severe bronchospasm, usually when it is associated with asthma.  HOME CARE INSTRUCTIONS   · Always have a plan prepared for seeking medical care. Know when to call your health care provider and local emergency services (911 in the [place] Know where you can access local emergency care.  · Only take medicines as  directed by your health care provider.  · If you were prescribed an inhaler or nebulizer machine, ask your health care provider to explain how to use it correctly. Always use a spacer with your inhaler if you were given one.  · It is necessary to remain calm during an attack. Try to relax and breathe more slowly.   · Control your home environment in the following ways:      Change your heating and air conditioning filter at least once a month.      Limit your use of fireplaces and wood stoves.    Do not smoke and do not allow smoking in your home.      Avoid exposure to perfumes and fragrances.      Get rid of pests (such as roaches and mice) and their droppings.      Throw away plants if you see mold on them.      Keep your house clean and dust free.      Replace carpet with wood, tile, or vinyl flooring. Carpet can trap dander and dust.      Use allergy-proof pillows, mattress covers, and box spring covers.      Wash bed sheets and blankets every week in hot water and dry them in a dryer.      Use blankets that are made of polyester or cotton.      Wash hands frequently.  SEEK MEDICAL CARE IF:   · You have muscle aches.    · You have chest pain.    · The sputum changes from clear or   white to yellow, green, gray, or bloody.    · The sputum you cough up gets thicker.    · There are problems that may be related to the medicine you are given, such as a rash, itching, swelling, or trouble breathing.    SEEK IMMEDIATE MEDICAL CARE IF:   · You have worsening wheezing and coughing even after taking your prescribed medicines.    · You have increased difficulty breathing.    · You develop severe chest pain.  MAKE SURE YOU:   · Understand these instructions.  · Will watch your condition.  · Will get help right away if you are not doing well or get worse.     This information is not intended to replace advice given to you by your health care provider. Make sure you discuss any questions you have with your health care  provider.     Document Released: 05/29/2003 Document Revised: 06/16/2014 Document Reviewed: 11/15/2012  Elsevier Interactive Patient Education ©2016 [employer]

## 2015-05-30 NOTE — ED Provider Notes (Signed)
CSN: [id number]     Arrival date & time 05/30/15  1817 History   First MD Initiated Contact with Patient 05/30/15 2014     Chief Complaint  Patient presents with  . Cough      HPI  Patient presents to the emergency department with ongoing nonproductive cough without orthopnea over the past month.  She denies chest pain shortness of breath.  She reports some watery diarrhea over the past 24 hours.  Mild sore throat.  Mild pain in her left ear.  She has no prior history of COPD but reports that she has had bronchodilators before in the past and these have been helpful.  She is currently out of all bronchodilators at home.  She is a diabetic.  She denies fevers and chills.  No dyspnea on exertion  Past Medical History  Diagnosis Date  . Diabetes mellitus without complication (HCC)   . Hypertension    Past Surgical History  Procedure Laterality Date  . Abdominal hysterectomy    . Tubal ligation    . Ectopic pregnancy surgery     No family history on file. Social History  Substance Use Topics  . Smoking status: Never Smoker   . Smokeless tobacco: None  . Alcohol Use: No   OB History    No data available     Review of Systems  All other systems reviewed and are negative.     Allergies  Ibuprofen  Home Medications   Prior to Admission medications   Medication Sig Start Date End Date Taking? Authorizing Provider  carvedilol (COREG) 25 MG tablet Take 1 tablet (25 mg total) by mouth 2 (two) times daily with a meal. 04/04/15  Yes Ozella Rocks, MD  ipratropium (ATROVENT) 0.06 % nasal spray Place 2 sprays into both nostrils 4 (four) times daily. 04/04/15  Yes Ozella Rocks, MD  lisinopril-hydrochlorothiazide (PRINZIDE,ZESTORETIC) 20-12.5 MG tablet Take 1 tablet by mouth 2 (two) times daily. 04/04/15  Yes Ozella Rocks, MD  metFORMIN (GLUCOPHAGE) 1000 MG tablet Take 1 tablet (1,000 mg total) by mouth 2 (two) times daily with a meal. 04/04/15  Yes Ozella Rocks, MD   omeprazole (PRILOSEC) 40 MG capsule Take 1 capsule (40 mg total) by mouth daily. Patient taking differently: Take 40 mg by mouth daily as needed (indigestion).  04/04/15  Yes Ozella Rocks, MD  Pseudoeph-Doxylamine-DM-APAP (NYQUIL PO) Take 30 mLs by mouth daily as needed (cold and flu symptoms).   Yes Historical Provider, MD  Pseudoephedrine-Ibuprofen 30-200 MG TABS Take 2 tablets by mouth daily as needed (cold and flu symptoms).   Yes Historical Provider, MD  sodium chloride (OCEAN) 0.65 % SOLN nasal spray Place 1 spray into both nostrils daily as needed for congestion.   Yes Historical Provider, MD  amLODipine (NORVASC) 5 MG tablet Take 1 tablet (5 mg total) by mouth daily. Patient not taking: Reported on 05/30/2015 04/04/15   Ozella Rocks, MD  azithromycin (ZITHROMAX Z-PAK) 250 MG tablet Take 1 tablet (250 mg total) by mouth daily. Take 2 tabs for first dose, then 1 tab for each additional dose 05/30/15   Azalia Bilis, MD   BP 151/94 mmHg  Pulse 90  Temp(Src) 98.3 F (36.8 C) (Oral)  Resp 18  Ht 5' (1.524 m)  Wt 212 lb (96.163 kg)  BMI 41.40 kg/m2  SpO2 100% Physical Exam  Constitutional: She is oriented to person, place, and time. She appears well-developed and well-nourished. No distress.  HENT:  Head:  Normocephalic and atraumatic.  Eyes: EOM are normal.  Neck: Normal range of motion.  Cardiovascular: Normal rate, regular rhythm and normal heart sounds.   Pulmonary/Chest: Effort normal. She has wheezes.  Abdominal: Soft. She exhibits no distension. There is no tenderness.  Musculoskeletal: Normal range of motion.  Neurological: She is alert and oriented to person, place, and time.  Skin: Skin is warm and dry.  Psychiatric: She has a normal mood and affect. Judgment normal.  Nursing note and vitals reviewed.   ED Course  Procedures (including critical care time) Labs Review Labs Reviewed - No data to display  Imaging Review Dg Chest 2 View  05/30/2015  CLINICAL  DATA:  54 year old female with cough and congestion x1 month EXAM: CHEST  2 VIEW COMPARISON:  Radiograph 05/27/2007 FINDINGS: The heart size and mediastinal contours are within normal limits. Both lungs are clear. The visualized skeletal structures are unremarkable. IMPRESSION: No active cardiopulmonary disease. Electronically Signed   By: Elgie CollardArash  Radparvar M.D.   On: 05/30/2015 18:55   I have personally reviewed and evaluated these images and lab results as part of my medical decision-making.   EKG Interpretation None      MDM   Final diagnoses:  Bronchitis with bronchospasm    10:33 PM Patient feels much better after bronchodilators in the emergency department.  A single dose of prednisone was given. Given her hx of diabetes I will not prescribe additional steroids for home.  She's been given albuterol inhaler to take home.  Given her persistent cough for one month I will cover her with azithromycin as pertussis is a possibility.  She reports that sometimes she coughs so hard that she nearly passes out.    Azalia BilisKevin Cassandra Mcmanaman, MD 05/30/15 [phone]

## 2018-07-12 ENCOUNTER — Other Ambulatory Visit (HOSPITAL_COMMUNITY): Payer: Self-pay | Admitting: *Deleted

## 2018-07-12 DIAGNOSIS — Z1231 Encounter for screening mammogram for malignant neoplasm of breast: Secondary | ICD-10-CM

## 2018-10-12 ENCOUNTER — Ambulatory Visit (HOSPITAL_COMMUNITY): Payer: Self-pay

## 2019-02-03 ENCOUNTER — Ambulatory Visit (HOSPITAL_COMMUNITY): Payer: Self-pay

## 2019-02-03 ENCOUNTER — Ambulatory Visit (HOSPITAL_COMMUNITY)
Admission: RE | Admit: 2019-02-03 | Discharge: 2019-02-03 | Disposition: A | Discharge status: Home / Self Care | Payer: Self-pay | Source: Ambulatory Visit | Attending: Obstetrics and Gynecology | Admitting: Obstetrics and Gynecology

## 2019-02-03 ENCOUNTER — Encounter (HOSPITAL_COMMUNITY): Payer: Self-pay

## 2019-02-03 ENCOUNTER — Other Ambulatory Visit: Payer: Self-pay

## 2019-02-03 ENCOUNTER — Ambulatory Visit
Admission: RE | Admit: 2019-02-03 | Discharge: 2019-02-03 | Disposition: A | Discharge status: Home / Self Care | Payer: No Typology Code available for payment source | Source: Ambulatory Visit | Attending: Obstetrics and Gynecology | Admitting: Obstetrics and Gynecology

## 2019-02-03 DIAGNOSIS — Z1231 Encounter for screening mammogram for malignant neoplasm of breast: Secondary | ICD-10-CM

## 2019-02-03 DIAGNOSIS — Z1239 Encounter for other screening for malignant neoplasm of breast: Secondary | ICD-10-CM

## 2019-02-03 NOTE — Progress Notes (Signed)
No complaints today.   Pap Smear: Pap smear not completed today. Last Pap smear was in 2007 and normal per patient. Per patient has no history of an abnormal Pap smear. Patient has a history of a hysterectomy 09/24/2005 due to AUB. Patient doesn't need any further Pap smears due to her history of a hysterectomy for benign reasons per BCCCP and ACOG guidelines. No Pap smear results are in Epic. Hysterectomy results are in Epic.  Physical exam: Breasts Breasts symmetrical. No skin abnormalities bilateral breasts. No nipple retraction bilateral breasts. No nipple discharge bilateral breasts. No lymphadenopathy. No lumps palpated bilateral breasts. No complaints of pain or tenderness on exam. Referred patient to the [facility] for a screening mammogram. Appointment scheduled for Thursday, February 03, 2019 at 1120.        Pelvic/Bimanual No Pap smear completed today since patient has a history of a hysterectomy for benign reasons. Pap smear not indicated per BCCCP guidelines.   Smoking History: Patient has never smoked.  Patient Navigation: Patient education provided. Access to services provided for patient through [facility] program.   Colorectal Cancer Screening: Per patient had a colonoscopy completed in 2016 in [place]. No complaints today.   Breast and Cervical Cancer Risk Assessment: Patient has no family history of breast cancer, known genetic mutations, or radiation treatment to the chest before age 54. Patient has no history of cervical dysplasia, immunocompromised, or DES exposure in-utero.  Risk Assessment    Risk Scores      02/03/2019   Last edited by: Armond Hang, LPN   5-year risk: 0.8 %   Lifetime risk: 5.8 %

## 2019-02-03 NOTE — Patient Instructions (Signed)
Explained breast self awareness with Luella Cook. Patient did not need a Pap smear today due to patient has a history of a hysterectomy for benign reasons. Let patient know that she doesn't need any further Pap smears due to her history of a hysterectomy for benign reasons. Referred patient to the [facility] for a screening mammogram. Appointment scheduled for Thursday, February 03, 2019 at 1120. Patient aware of appointment and will be there. Let patient know the [facility] will follow up with her within the next couple weeks with results of mammogram by letter or phone. Luella Cook verbalized understanding.  Kiyoshi Schaab, Arvil Chaco, RN 12:08 PM

## 2019-02-21 ENCOUNTER — Encounter: Payer: Self-pay | Admitting: *Deleted

## 2020-05-02 ENCOUNTER — Emergency Department (HOSPITAL_COMMUNITY): Payer: Self-pay

## 2020-05-02 ENCOUNTER — Emergency Department (HOSPITAL_COMMUNITY)
Admission: EM | Admit: 2020-05-02 | Discharge: 2020-05-02 | Disposition: A | Discharge status: Home / Self Care | Payer: Self-pay | Attending: Emergency Medicine | Admitting: Emergency Medicine

## 2020-05-02 ENCOUNTER — Other Ambulatory Visit: Payer: Self-pay

## 2020-05-02 DIAGNOSIS — Z7984 Long term (current) use of oral hypoglycemic drugs: Secondary | ICD-10-CM | POA: Insufficient documentation

## 2020-05-02 DIAGNOSIS — Z79899 Other long term (current) drug therapy: Secondary | ICD-10-CM | POA: Insufficient documentation

## 2020-05-02 DIAGNOSIS — I160 Hypertensive urgency: Secondary | ICD-10-CM

## 2020-05-02 DIAGNOSIS — R739 Hyperglycemia, unspecified: Secondary | ICD-10-CM

## 2020-05-02 DIAGNOSIS — I10 Essential (primary) hypertension: Secondary | ICD-10-CM | POA: Insufficient documentation

## 2020-05-02 DIAGNOSIS — E119 Type 2 diabetes mellitus without complications: Secondary | ICD-10-CM | POA: Insufficient documentation

## 2020-05-02 LAB — CBC
HCT: 41 % (ref 36.0–46.0)
Hemoglobin: 13 g/dL (ref 12.0–15.0)
MCH: 26.4 pg (ref 26.0–34.0)
MCHC: 31.7 g/dL (ref 30.0–36.0)
MCV: 83.3 fL (ref 80.0–100.0)
Platelets: 221 10*3/uL (ref 150–400)
RBC: 4.92 MIL/uL (ref 3.87–5.11)
RDW: 13.4 % (ref 11.5–15.5)
WBC: 7 10*3/uL (ref 4.0–10.5)
nRBC: 0 % (ref 0.0–0.2)

## 2020-05-02 LAB — CBG MONITORING, ED
Glucose-Capillary: 345 mg/dL — ABNORMAL HIGH (ref 70–99)
Glucose-Capillary: 209 mg/dL — ABNORMAL HIGH (ref 70–99)

## 2020-05-02 LAB — BASIC METABOLIC PANEL
Anion gap: 11 (ref 5–15)
BUN: 11 mg/dL (ref 6–20)
CO2: 25 mmol/L (ref 22–32)
Calcium: 10.2 mg/dL (ref 8.9–10.3)
Chloride: 97 mmol/L — ABNORMAL LOW (ref 98–111)
Creatinine, Ser: 0.66 mg/dL (ref 0.44–1.00)
GFR, Estimated: >60 mL/min (ref >60)
Glucose, Bld: 417 mg/dL — ABNORMAL HIGH (ref 70–99)
Potassium: 4.3 mmol/L (ref 3.5–5.1)
Sodium: 133 mmol/L — ABNORMAL LOW (ref 135–145)

## 2020-05-02 LAB — TROPONIN I (HIGH SENSITIVITY)
Troponin I (High Sensitivity): 3 ng/L (ref <18)
Troponin I (High Sensitivity): 5 ng/L (ref <18)

## 2020-05-02 LAB — I-STAT BETA HCG BLOOD, ED (MC, WL, AP ONLY): I-stat hCG, quantitative: <5 m[IU]/mL (ref <5)

## 2020-05-02 MED ORDER — BLOOD GLUCOSE MONITOR KIT: PACK | 0 refills | Status: DC

## 2020-05-02 MED ORDER — INSULIN ASPART 100 UNIT/ML ~~LOC~~ SOLN
8.0000 [IU] | Freq: Once | SUBCUTANEOUS | Status: AC
  Administered 2020-05-02: 8 [IU] via INTRAVENOUS

## 2020-05-02 MED ORDER — CLONIDINE HCL 0.1 MG PO TABS
0.1000 mg | ORAL_TABLET | Freq: Once | ORAL | Status: AC
Start: 2020-05-02 — End: 2020-05-02
  Administered 2020-05-02: 0.1 mg via ORAL
  Filled 2020-05-02: qty 1

## 2020-05-02 MED ORDER — SODIUM CHLORIDE 0.9 % IV BOLUS
1000.0000 mL | Freq: Once | INTRAVENOUS | Status: AC
  Administered 2020-05-02: 1000 mL via INTRAVENOUS

## 2020-05-02 NOTE — ED Triage Notes (Signed)
Endorsed hx of high blood pressure. Stated its been running higher lately even with prescription drugs. C/O generalized fatigue, weakness, shortness of breath and headache.

## 2020-05-02 NOTE — ED Notes (Signed)
Patient verbalizes understanding of discharge instructions. Opportunity for questioning and answers were provided. Armband removed by staff, pt discharged from ED ambulatory to home.  

## 2020-05-02 NOTE — Discharge Instructions (Signed)
Keep a record of your blood pressure and blood sugars over the next several days and follow-up with your primary doctor for a recheck.  Return to the ER in the meantime if symptoms significantly worsen or change.

## 2020-05-02 NOTE — ED Provider Notes (Signed)
[facility] EMERGENCY DEPARTMENT Provider Note   CSN: [id number] Arrival date & time: 05/02/20  1359     History Chief Complaint  Patient presents with  . Hypertension    Sherry Cook is a 59 y.o. female.  Patient is a 59 year old female with history of diabetes and hypertension.  She presents today for evaluation of elevated blood pressure.  Patient has been checking her blood pressures at home and it has been in the 190s systolic.  She reports taking her medications as prescribed and denies any other symptoms with the exception of scratchy throat.  She tells me she has been drinking orange juice to try and help this.  She denies any fevers or chills.  She denies any ill contacts.  She denies any headache, blurry vision, numbness, or tingling.  The history is provided by the patient.       Past Medical History:  Diagnosis Date  . Diabetes mellitus without complication (HCC)   . Hypertension     Patient Active Problem List   Diagnosis Date Noted  . Screening breast examination 02/03/2019    Past Surgical History:  Procedure Laterality Date  . ABDOMINAL HYSTERECTOMY    . ECTOPIC PREGNANCY SURGERY    . TUBAL LIGATION       OB History    Gravida  3   Para      Term      Preterm      AB  1   Living  2     SAB      TAB      Ectopic  1   Multiple      Live Births  2           Family History  Problem Relation Age of Onset  . Diabetes Sister   . Hypertension Sister   . Diabetes Brother   . Hypertension Brother     Social History   Tobacco Use  . Smoking status: Never Smoker  . Smokeless tobacco: Never Used  Vaping Use  . Vaping Use: Never used  Substance Use Topics  . Alcohol use: No  . Drug use: No    Home Medications Prior to Admission medications   Medication Sig Start Date End Date Taking? Authorizing Provider  azithromycin (ZITHROMAX Z-PAK) 250 MG tablet Take 1 tablet (250 mg total) by mouth daily. Take  2 tabs for first dose, then 1 tab for each additional dose 05/30/15  Yes Azalia Bilis, MD  amLODipine (NORVASC) 5 MG tablet Take 1 tablet (5 mg total) by mouth daily. 04/04/15   Ozella Rocks, MD  Canagliflozin (INVOKANA PO) Take by mouth.    [provider]  carvedilol (COREG) 25 MG tablet Take 1 tablet (25 mg total) by mouth 2 (two) times daily with a meal. Patient not taking: Reported on 02/03/2019 04/04/15   Ozella Rocks, MD  ipratropium (ATROVENT) 0.06 % nasal spray Place 2 sprays into both nostrils 4 (four) times daily. 04/04/15   Ozella Rocks, MD  lisinopril-hydrochlorothiazide (PRINZIDE,ZESTORETIC) 20-12.5 MG tablet Take 1 tablet by mouth 2 (two) times daily. 04/04/15   Ozella Rocks, MD  metFORMIN (GLUCOPHAGE) 1000 MG tablet Take 1 tablet (1,000 mg total) by mouth 2 (two) times daily with a meal. 04/04/15   Ozella Rocks, MD  omeprazole (PRILOSEC) 40 MG capsule Take 1 capsule (40 mg total) by mouth daily. Patient not taking: Reported on 05/02/2020 04/04/15   Ozella Rocks, MD  Pseudoeph-Doxylamine-DM-APAP (NYQUIL PO) Take 30 mLs by mouth daily as needed (cold and flu symptoms).    [provider]  Pseudoephedrine-Ibuprofen 30-200 MG TABS Take 2 tablets by mouth daily as needed (cold and flu symptoms).    [provider]  sodium chloride (OCEAN) 0.65 % SOLN nasal spray Place 1 spray into both nostrils daily as needed for congestion.    [provider]    Allergies    Ibuprofen  Review of Systems   Review of Systems  All other systems reviewed and are negative.   Physical Exam Updated Vital Signs BP (!) 163/110 (BP Location: Left Arm)   Pulse 84   Temp 98.7 F (37.1 C) (Oral)   Resp 17   Ht 5' (1.524 m)   Wt 91.6 kg   SpO2 94%   BMI 39.45 kg/m   Physical Exam Vitals and nursing note reviewed.  Constitutional:      General: She is not in acute distress.    Appearance: She is well-developed. She is not diaphoretic.    HENT:     Head: Normocephalic and atraumatic.  Cardiovascular:     Rate and Rhythm: Normal rate and regular rhythm.     Heart sounds: No murmur heard.  No friction rub. No gallop.   Pulmonary:     Effort: Pulmonary effort is normal. No respiratory distress.     Breath sounds: Normal breath sounds. No wheezing.  Abdominal:     General: Bowel sounds are normal. There is no distension.     Palpations: Abdomen is soft.     Tenderness: There is no abdominal tenderness.  Musculoskeletal:        General: Normal range of motion.     Cervical back: Normal range of motion and neck supple.  Skin:    General: Skin is warm and dry.  Neurological:     General: No focal deficit present.     Mental Status: She is alert and oriented to person, place, and time.     Cranial Nerves: No cranial nerve deficit.     ED Results / Procedures / Treatments   Labs (all labs ordered are listed, but only abnormal results are displayed) Labs Reviewed  BASIC METABOLIC PANEL - Abnormal; Notable for the following components:      Result Value   Sodium 133 (*)    Chloride 97 (*)    Glucose, Bld 417 (*)    All other components within normal limits  CBC  I-STAT BETA HCG BLOOD, ED ([facility], [facility], [facility] ONLY)  TROPONIN I (HIGH SENSITIVITY)  TROPONIN I (HIGH SENSITIVITY)    EKG None  Radiology DG Chest 2 View  Result Date: 05/02/2020 CLINICAL DATA:  Shortness of breath.  Elevated blood pressure EXAM: CHEST - 2 VIEW COMPARISON:  May 30, 2015 FINDINGS: Tracheal air column is normal. Heart size is normal. Mediastinal contours are unremarkable. Hilar structures within normal limits. Lungs are clear. No sign of pleural effusion. No acute skeletal process on limited assessment. IMPRESSION: No acute cardiopulmonary disease. Electronically Signed   By: Donzetta Kohut M.D.   On: 05/02/2020 15:17    Procedures Procedures (including critical care time)  Medications Ordered in ED Medications  sodium chloride 0.9 %  bolus 1,000 mL (has no administration in time range)  insulin aspart (novoLOG) injection 8 Units (has no administration in time range)  cloNIDine (CATAPRES) tablet 0.1 mg (has no administration in time range)    ED Course  I have reviewed the  triage vital signs and the nursing notes.  Pertinent labs & imaging results that were available during my care of the patient were reviewed by me and considered in my medical decision making (see chart for details).    MDM Rules/Calculators/A&P  Patient presenting here with elevated blood pressure and blood sugar.  There are no signs of endorgan damage in her work-up.  Patient was given clonidine as well as IV fluids and insulin.  Her sugar has improved from over 400 to just over 200.  Her blood pressure is now 131/57.  Patient seems appropriate for discharge with outpatient monitoring of her sugars and blood pressure.  To return as needed for any problems.  Final Clinical Impression(s) / ED Diagnoses Final diagnoses:  None    Rx / DC Orders ED Discharge Orders    None       Geoffery Lyons, MD 05/02/20 [phone]

## 2021-02-12 ENCOUNTER — Other Ambulatory Visit: Payer: Self-pay | Admitting: Obstetrics and Gynecology

## 2021-02-12 DIAGNOSIS — Z1231 Encounter for screening mammogram for malignant neoplasm of breast: Secondary | ICD-10-CM

## 2021-03-21 ENCOUNTER — Ambulatory Visit
Admission: RE | Admit: 2021-03-21 | Discharge: 2021-03-21 | Disposition: A | Discharge status: Home / Self Care | Payer: No Typology Code available for payment source | Source: Ambulatory Visit | Attending: Obstetrics and Gynecology | Admitting: Obstetrics and Gynecology

## 2021-03-21 ENCOUNTER — Other Ambulatory Visit: Payer: Self-pay

## 2021-03-21 ENCOUNTER — Ambulatory Visit: Payer: Self-pay | Admitting: *Deleted

## 2021-03-21 VITALS — BP 156/100 | Wt 191.1 lb

## 2021-03-21 DIAGNOSIS — Z1239 Encounter for other screening for malignant neoplasm of breast: Secondary | ICD-10-CM

## 2021-03-21 DIAGNOSIS — Z1231 Encounter for screening mammogram for malignant neoplasm of breast: Secondary | ICD-10-CM

## 2021-03-21 NOTE — Patient Instructions (Addendum)
Explained breast self awareness with Dennard Schaumann. Patient did not need a Pap smear today due to patient has a history of a hysterectomy for benign reasons. Let patient know that she doesn't need any further Pap smears due to her history of a hysterectomy for benign reasons. [facility] to the [facility] for a screening mammogram on mobile unit. Appointment scheduled Thursday, March 21, 2021 at 1420. Patient escorted to the mobile unit following BCCCP appointment for a screening mammogram. Let patient know the Breast Center will follow up with her within the next couple weeks with results of her mammogram by letter or phone. Dennard Schaumann verbalized understanding.  Ash Mcelwain, Kathaleen Maser, RN 1:56 PM

## 2021-03-21 NOTE — Progress Notes (Signed)
Ms. Analy Cook is a 60 y.o. female who presents to [facility] clinic today with no complaints.    Pap Smear: Pap smear not completed today. Last Pap smear was in 2007 and normal per patient. Per patient has no history of an abnormal Pap smear. Patient has a history of a hysterectomy 09/24/2005 due to AUB and Fibroids. Patient doesn't need any further Pap smears due to her history of a hysterectomy for benign reasons per BCCCP and ASCCP guidelines. No Pap smear results are in Epic. Hysterectomy results are in Epic.   Physical exam: Breasts Breasts symmetrical. No skin abnormalities bilateral breasts. No nipple retraction bilateral breasts. No nipple discharge bilateral breasts. No lymphadenopathy. No lumps palpated bilateral breasts. No complaints of pain or tenderness on exam.  MS DIGITAL SCREENING TOMO BILATERAL  Result Date: 02/04/2019 CLINICAL DATA:  Screening. EXAM: DIGITAL SCREENING BILATERAL MAMMOGRAM WITH TOMO AND CAD COMPARISON:  Previous exam(s). ACR Breast Density Category b: There are scattered areas of fibroglandular density. FINDINGS: There are no findings suspicious for malignancy. Images were processed with CAD. IMPRESSION: No mammographic evidence of malignancy. A result letter of this screening mammogram will be mailed directly to the patient. RECOMMENDATION: Screening mammogram in one year. (Code:SM-B-01Y) BI-RADS CATEGORY  1: Negative. Electronically Signed   By: Norva Pavlov M.D.   On: 02/04/2019 07:37        Pelvic/Bimanual Pap is not indicated today per BCCCP guidelines.   Smoking History: Patient has never smoked.   Patient Navigation: Patient education provided. Access to services provided for patient through [facility] program.   Colorectal Cancer Screening: Per patient had a colonoscopy completed in 2016 in [place]. Patient stated she completed a FIT Test in March or April 2022 at her PCP office [facility] and that is was negative. No  complaints today.    Breast and Cervical Cancer Risk Assessment: Patient has family history of her sister having breast cancer. Patient has no known genetic mutations or history of radiation treatment to the chest before age 60. Patient does not have history of cervical dysplasia, immunocompromised, or DES exposure in-utero.  Risk Assessment     Risk Scores       03/21/2021 02/03/2019   Last edited by: Narda Rutherford, LPN Stoney Bang H, LPN   5-year risk: 1.1 % 0.8 %   Lifetime risk: 5.8 % 5.8 %            A: BCCCP exam without pap smear No complaints.  P: Referred patient to the [facility] for a screening mammogram on mobile unit. Appointment scheduled Thursday, March 21, 2021 at 1420.  Priscille Heidelberg, RN 03/21/2021 1:56 PM

## 2022-05-28 IMAGING — MG MM DIGITAL SCREENING BILAT W/ TOMO AND CAD
8 series · 8 of 24 positions shown · non-contrast
Comparison: Previous exam(s).

CLINICAL DATA: Screening.

EXAM:
DIGITAL SCREENING BILATERAL MAMMOGRAM WITH TOMOSYNTHESIS AND CAD
TECHNIQUE: Bilateral screening digital craniocaudal and mediolateral oblique
mammograms were obtained. Bilateral screening digital breast
tomosynthesis was performed. The images were evaluated with
computer-aided detection.

[L MLO synth-2D]
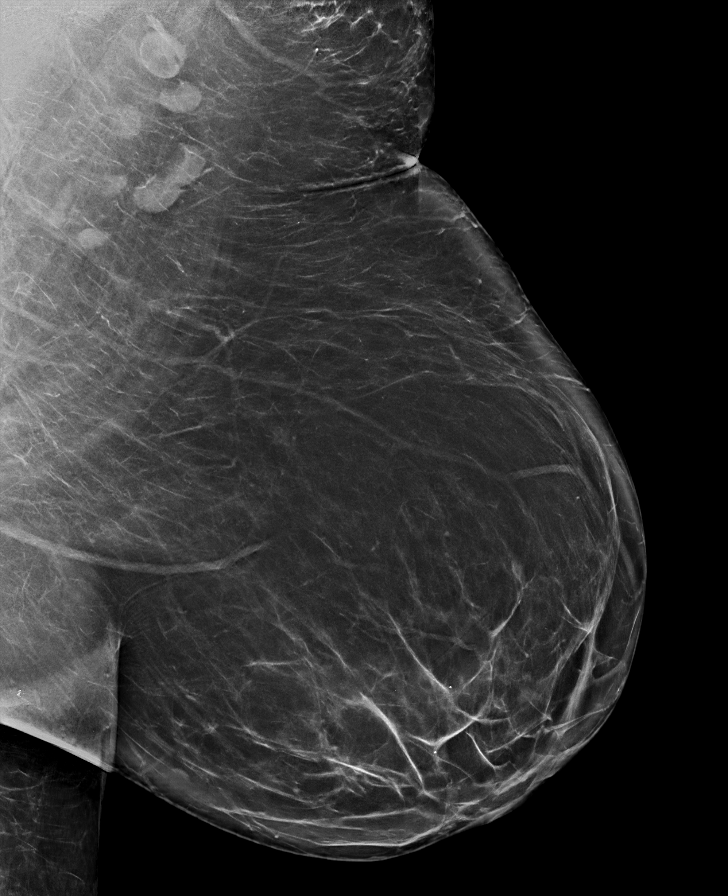

[R CC synth-2D]
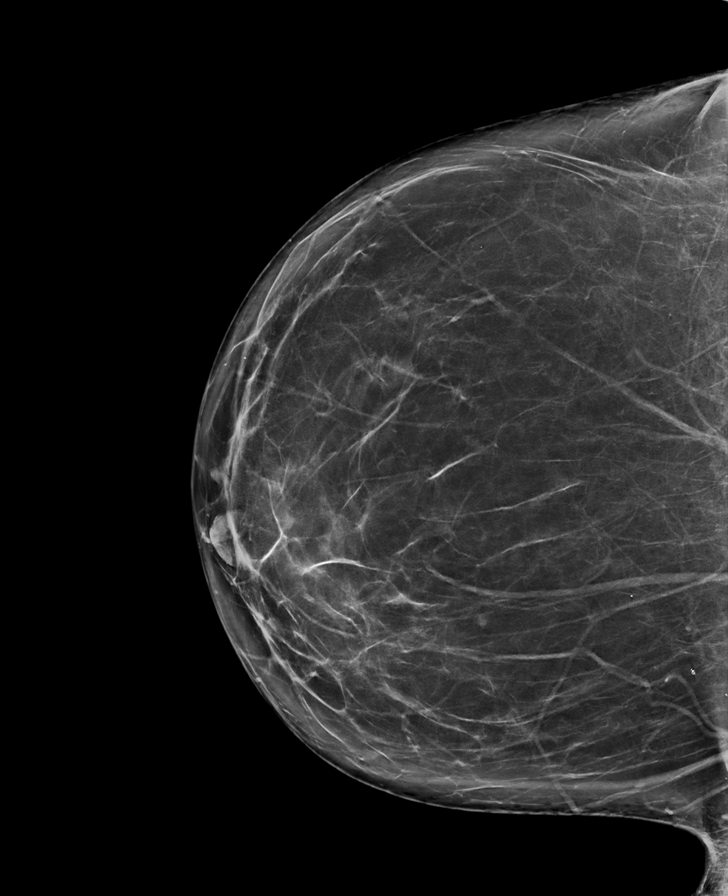

[L CC synth-2D]
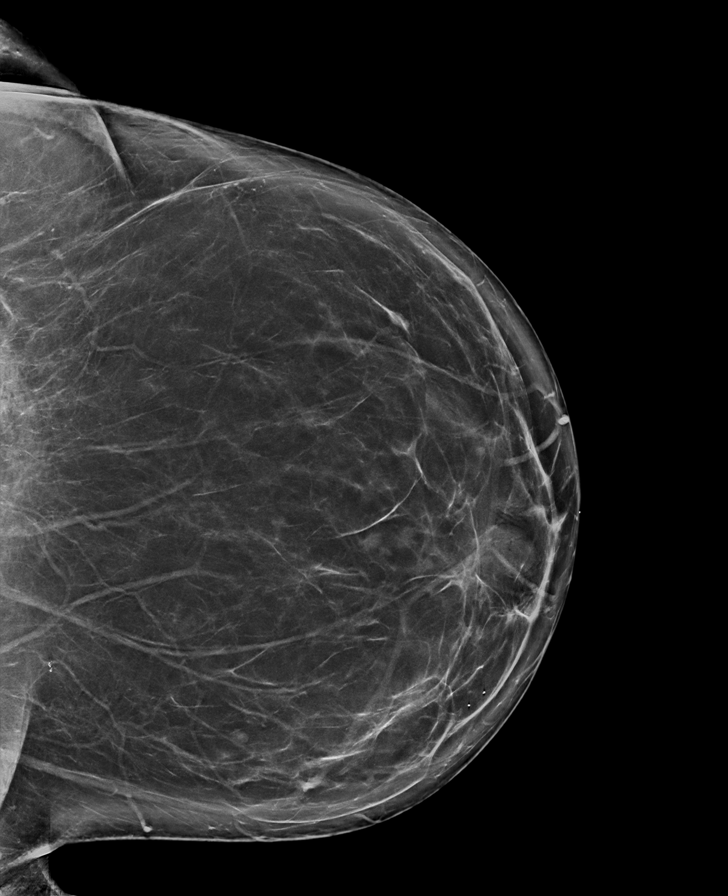

[R MLO synth-2D]
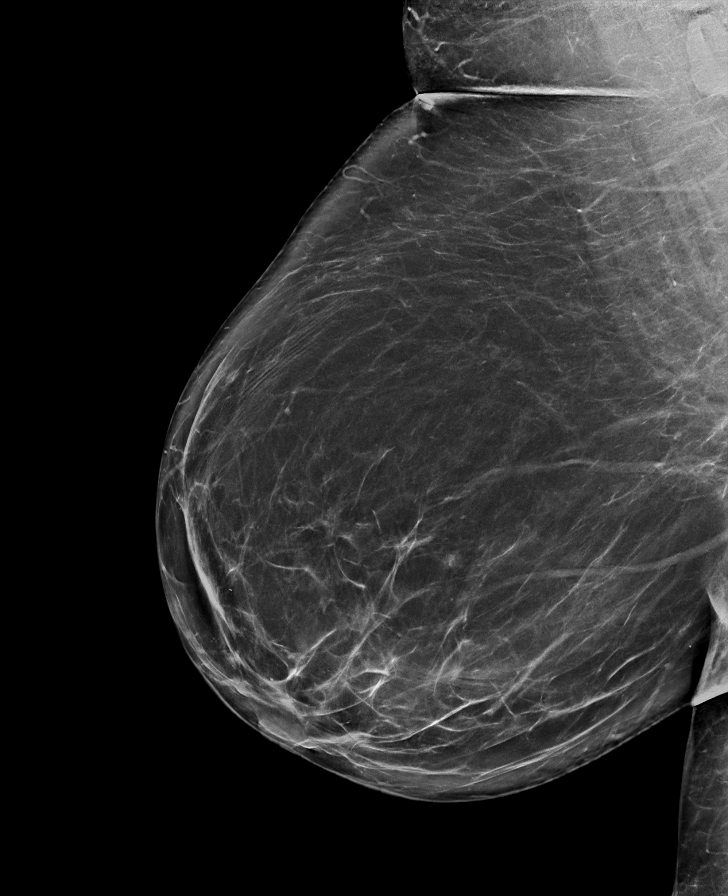

[R CC tomo · tomo slice 45/89.0]
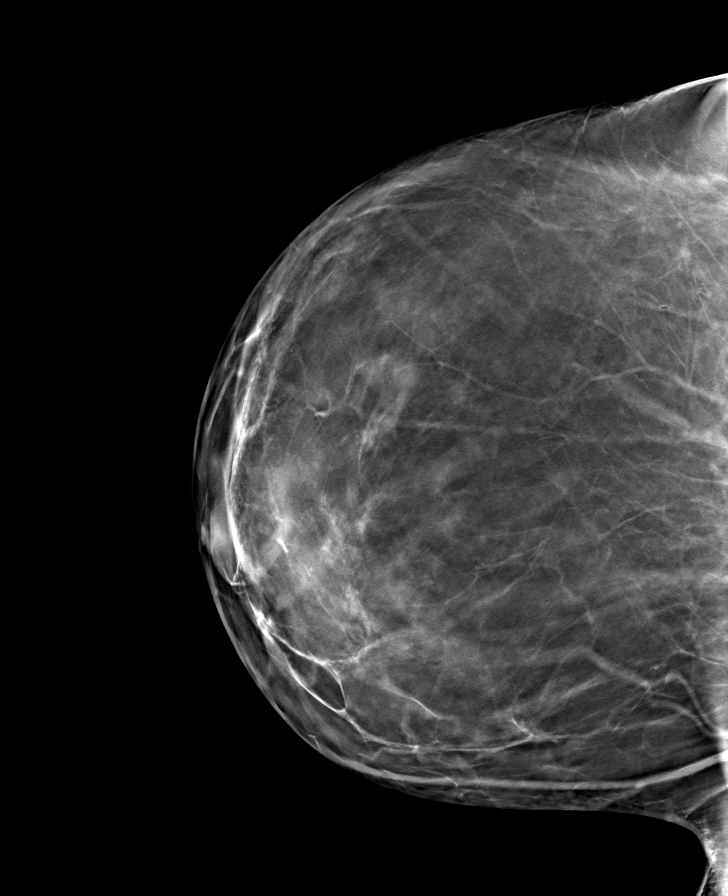

[L MLO tomo · tomo slice 49/98.0]
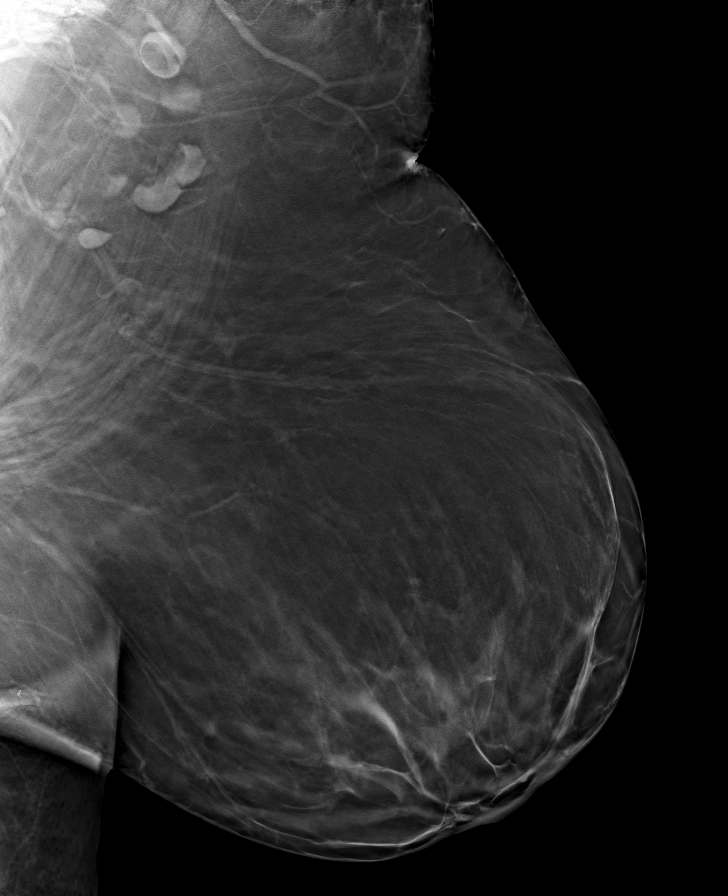

[R MLO tomo · tomo slice 47/94.0]
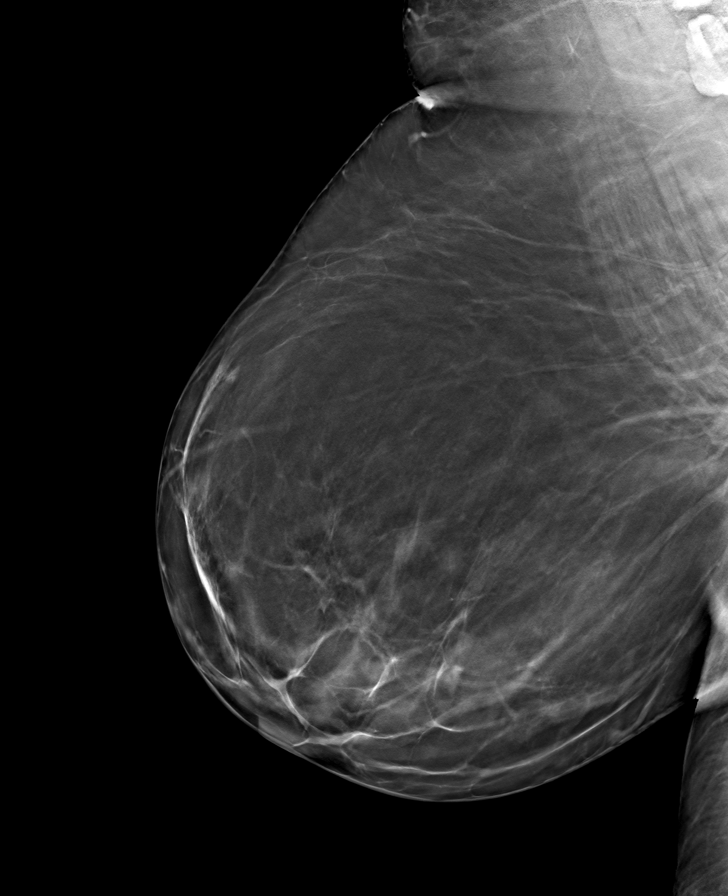

[L CC tomo · tomo slice 45/89.0]
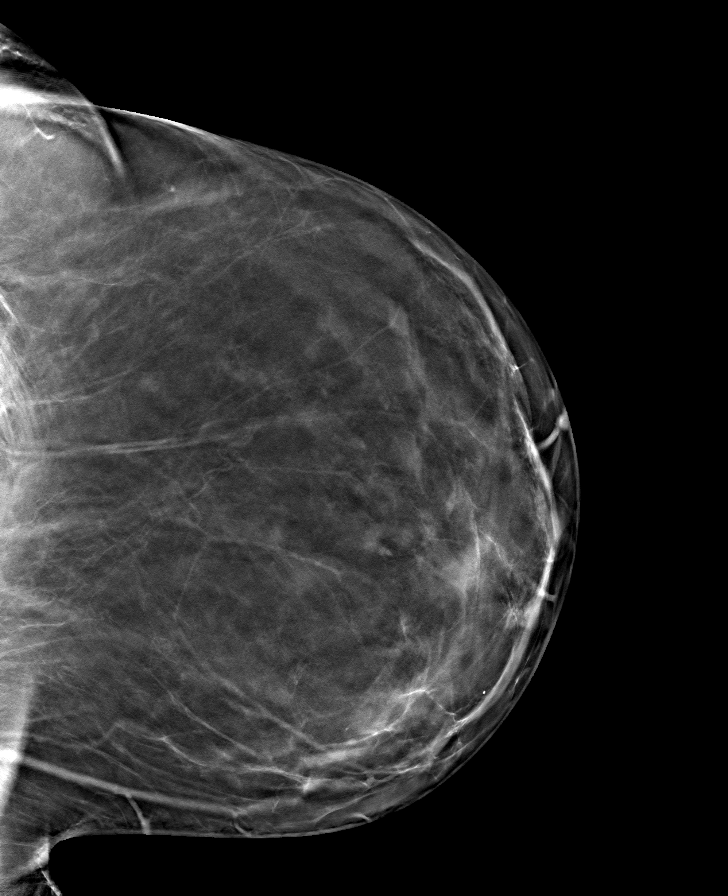

[8 of 24 positions shown; findings below may reference images not displayed]

ACR Breast Density Category b: There are scattered areas of
fibroglandular density.
FINDINGS: There are no findings suspicious for malignancy.
IMPRESSION: No mammographic evidence of malignancy. A result letter of this
screening mammogram will be mailed directly to the patient.

RECOMMENDATION:
Screening mammogram in one year. (Code:51-O-LD2)

BI-RADS CATEGORY  1: Negative.

## 2022-06-11 ENCOUNTER — Other Ambulatory Visit: Payer: Self-pay | Admitting: Internal Medicine

## 2022-06-11 DIAGNOSIS — Z1231 Encounter for screening mammogram for malignant neoplasm of breast: Secondary | ICD-10-CM

## 2022-08-01 ENCOUNTER — Ambulatory Visit
Admission: RE | Admit: 2022-08-01 | Discharge: 2022-08-01 | Disposition: A | Discharge status: Home / Self Care | Payer: Medicaid Other | Source: Ambulatory Visit | Attending: Internal Medicine | Admitting: Internal Medicine

## 2022-08-01 DIAGNOSIS — Z1231 Encounter for screening mammogram for malignant neoplasm of breast: Secondary | ICD-10-CM

## 2022-08-18 ENCOUNTER — Other Ambulatory Visit: Payer: Self-pay

## 2022-08-18 NOTE — Progress Notes (Signed)
Med list update from referral  from Dr. Nolene Ebbs 08-04-22

## 2022-08-22 ENCOUNTER — Ambulatory Visit (INDEPENDENT_AMBULATORY_CARE_PROVIDER_SITE_OTHER): Payer: Medicaid Other | Admitting: Gastroenterology

## 2022-08-22 ENCOUNTER — Encounter: Payer: Self-pay | Admitting: Gastroenterology

## 2022-08-22 VITALS — BP 132/84 | HR 85 | Ht 60.0 in | Wt 189.0 lb

## 2022-08-22 DIAGNOSIS — K219 Gastro-esophageal reflux disease without esophagitis: Secondary | ICD-10-CM

## 2022-08-22 DIAGNOSIS — K625 Hemorrhage of anus and rectum: Secondary | ICD-10-CM

## 2022-08-22 DIAGNOSIS — Z86010 Personal history of colonic polyps: Secondary | ICD-10-CM | POA: Diagnosis not present

## 2022-08-22 DIAGNOSIS — K5909 Other constipation: Secondary | ICD-10-CM

## 2022-08-22 DIAGNOSIS — Z79899 Other long term (current) drug therapy: Secondary | ICD-10-CM

## 2022-08-22 MED ORDER — BACLOFEN 10 MG PO TABS: 10.0000 mg | ORAL_TABLET | Freq: Every day | ORAL | 1 refills | Status: DC

## 2022-08-22 MED ORDER — SUTAB 1479-225-188 MG PO TABS: ORAL_TABLET | ORAL | 0 refills | Status: DC

## 2022-08-22 NOTE — Progress Notes (Signed)
HPI :  62 year old female with a history of diabetes, GERD, constipation, history of colon polyps, here to establish care for some of these issues.  Referred by Lily Peer FNP.  Patient states she has had constipation for a very long time, as long as she can remember.  If she does not take anything to move her bowels she will have a bowel movement perhaps once or twice per week, perhaps longer at times.  She usually takes an over-the-counter laxative from [facility], a variety of types, to help her move her bowels.  She never takes anything daily or preventatively.  She has a lot of bloating in her abdomen with abdominal distention if she does not move her bowels for a while.  When she has a bowel movement her stomach feels better.  She has had occasional rectal bleeding when she is constipated.  She does have straining and passes hard stools.  She states she had a colonoscopy in 2016 for these reasons, she states she has a copy of it at home but does not have that today.  It was done for bleeding at that time, she had some polyps removed and was told she needed another exam in 5 years or so.  She denies any family history of colon cancer.  She is on medication regimen as listed below, on Topamax for migraines, she has been on that before and does not think related to her bowels.  She has been on Nexium 40 mg once daily for reflux symptoms.  She states that she has had some occasional daytime symptoms that bother her but tends to be pretty mild the Nexium works okay for that.  Where she has a lot of problems at night when she lies down, she has a lot of regurgitation and pyrosis with that.  She eats dinner at around 7 PM and goes to bed at 11, tries to go to sleep with an empty stomach.  She sleeps with head of her bed elevated and despite that she has a lot of nocturnal symptoms.  She had an EGD many years ago in [place], she does not recall what that showed.    Otherwise denies any  cardiopulmonary symptoms.  Past Medical History:  Diagnosis Date   Allergic rhinitis    Depression    Diabetes mellitus without complication (HCC)    GERD (gastroesophageal reflux disease)    History of migraine headaches    Hyperlipidemia    Hypertension      Past Surgical History:  Procedure Laterality Date   ABDOMINAL HYSTERECTOMY  10/2004   partial   ECTOPIC PREGNANCY SURGERY     TUBAL LIGATION     Family History  Problem Relation Age of Onset   Parkinson's disease Mother    Asthma Father    Diabetes Sister    Hypertension Sister    Breast cancer Sister    Diabetes Brother    Hypertension Brother    Social History   Tobacco Use   Smoking status: Never   Smokeless tobacco: Never  Vaping Use   Vaping Use: Never used  Substance Use Topics   Alcohol use: No   Drug use: No   Current Outpatient Medications  Medication Sig Dispense Refill   amLODipine (NORVASC) 10 MG tablet Take 10 mg by mouth daily.     carvedilol (COREG) 25 MG tablet Take 1 tablet (25 mg total) by mouth 2 (two) times daily with a meal. 60 tablet 1   cetirizine (  ZYRTEC) 10 MG tablet Take 10 mg by mouth at bedtime as needed for allergies.     esomeprazole (NEXIUM) 40 MG capsule Take 40 mg by mouth daily.     FLUoxetine (PROZAC) 20 MG capsule Take 20 mg by mouth daily.     losartan-hydrochlorothiazide (HYZAAR) 50-12.5 MG tablet Take 1 tablet by mouth daily.     topiramate (TOPAMAX) 50 MG tablet Take 50 mg by mouth daily.     No current facility-administered medications for this visit.   Allergies  Allergen Reactions   Ibuprofen Hives and Shortness Of Breath    Orange coated     Review of Systems: All systems reviewed and negative except where noted in HPI.   Lab Results  Component Value Date   WBC 7.0 05/02/2020   HGB 13.0 05/02/2020   HCT 41.0 05/02/2020   MCV 83.3 05/02/2020   PLT 221 05/02/2020    Lab Results  Component Value Date   CREATININE 0.66 05/02/2020   BUN 11  05/02/2020   NA 133 (L) 05/02/2020   K 4.3 05/02/2020   CL 97 (L) 05/02/2020   CO2 25 05/02/2020    Lab Results  Component Value Date   ALT 26 05/27/2007   AST 21 05/27/2007   ALKPHOS 52 05/27/2007   BILITOT 0.8 05/27/2007     Physical Exam: BP 132/84   Pulse 85   Ht 5' (1.524 m)   Wt 189 lb (85.7 kg)   BMI 36.91 kg/m  Constitutional: Pleasant,well-developed, female in no acute distress. HEENT: Normocephalic and atraumatic. Conjunctivae are normal. No scleral icterus. Neck supple.  Cardiovascular: Normal rate, regular rhythm.  Pulmonary/chest: Effort normal and breath sounds normal.  Abdominal: Soft, nondistended, nontender. There are no masses palpable.  Extremities: no edema Lymphadenopathy: No cervical adenopathy noted. Neurological: Alert and oriented to person place and time. Skin: Skin is warm and dry. No rashes noted. Psychiatric: Normal mood and affect. Behavior is normal.   ASSESSMENT: 62 y.o. female here for assessment of the following  1. Chronic constipation   2. Rectal bleeding   3. History of colon polyps   4. Gastroesophageal reflux disease, unspecified whether esophagitis present   5. Long-term current use of proton pump inhibitor therapy    Longstanding constipation with occasional rectal bleeding.  She states she has had rectal bleeding for years and was told due to hemorrhoids at time of her last colonoscopy in 2016.  We discussed her constipation history, this is longstanding and she has never been on a daily regimen to treat this from what she says, only using as needed laxatives.  Discussed options with her.  Recommend MiraLAX twice daily dosing with titration up or down as needed over the next few weeks and see how she does with that.  If that does not provide benefit then we will consider another regimen such as Linzess etc.  She is agreeable with that.  Otherwise she is fairly certain she is overdue for surveillance colonoscopy for history of  polyps, she will send [place] a copy of that report which she has at home.  I discussed risk benefits of colonoscopy and anesthesia and she wants to proceed.  At this time time, given her ongoing reflux symptoms, I offered her an upper endoscopy to assess this issue as well at the same time as her colonoscopy.  She wishes to proceed.  On PPI once daily which seems to control most of her pyrosis but she is having a lot of nocturnal  regurgitation despite lifestyle modifications.  Discussed options for that, will try some baclofen 10 mg nightly for nocturnal reflux which may help partially better and reduce the symptoms.  Further recommendations pending results of her endoscopy and her course.  She will continue Nexium for now.  Discussed long-term risk benefits of chronic PPI use, she understands  PLAN: - Miralax twice daily - titrate as needed up or down. If no benefit after a few weeks contact us - schedule EGD and colonoscopy in the [place] - double prep will be used for colonoscopy - continue nexium - discussed risks / benefits of long term PPI use - will add baclofen 10mg  q HS for nocturnal reflux symptoms - she will send [place] her old colonoscopy report via Mychart message.  Jolly Mango, MD [facility]  CC: Delford Field, FNP

## 2022-08-22 NOTE — Patient Instructions (Addendum)
We have sent the following medications to your pharmacy for you to pick up at your convenience: Baclofen 10mg    Continue Nexium   Start taking Miralax twice daily as needed.   Please call us in 2-3 weeks if you see no improvement.   You have been scheduled for a colonoscopy and EGD. Please follow written instructions given to you at your visit today.  Please pick up your prep supplies at the pharmacy within the next 1-3 days. If you use inhalers (even only as needed), please bring them with you on the day of your procedure.   _______________________________________________________  If your blood pressure at your visit was 140/90 or greater, please contact your primary care physician to follow up on this.  _______________________________________________________  If you are age 23 or older, your body mass index should be between 23-30. Your Body mass index is 36.91 kg/m. If this is out of the aforementioned range listed, please consider follow up with your Primary Care Provider.  If you are age 50 or younger, your body mass index should be between 19-25. Your Body mass index is 36.91 kg/m. If this is out of the aformentioned range listed, please consider follow up with your Primary Care Provider.   ________________________________________________________  The [facility] providers would like to encourage you to use [facility] to communicate with providers for non-urgent requests or questions.  Due to long hold times on the telephone, sending your provider a message by [facility] may be a faster and more efficient way to get a response.  Please allow 48 business hours for a response.  Please remember that this is for non-urgent requests.  _______________________________________________________  It was a pleasure to see you today!  Thank you for trusting me with your gastrointestinal care!

## 2022-08-29 ENCOUNTER — Telehealth: Payer: Self-pay | Admitting: Gastroenterology

## 2022-08-29 NOTE — Telephone Encounter (Signed)
Inbound call from patient request to speak with a nurse in regards some results she have to send over

## 2022-09-02 NOTE — Telephone Encounter (Signed)
She restarted on Ozempic and Metformin. She is advised to hold the Metformin the am of her procedure and to hold the Ozempic for one week prior. She verbalized understanding.

## 2022-09-02 NOTE — Telephone Encounter (Signed)
Patient called to report that she has restarted on Ozempic

## 2022-09-02 NOTE — Telephone Encounter (Signed)
Left message for patient to call back via a recording on her phone that says the recipient will get a copy as a transcript.

## 2022-09-02 NOTE — Telephone Encounter (Signed)
PT returning call

## 2022-09-08 ENCOUNTER — Other Ambulatory Visit (HOSPITAL_COMMUNITY): Payer: Self-pay

## 2022-09-11 ENCOUNTER — Other Ambulatory Visit (HOSPITAL_COMMUNITY): Payer: Self-pay

## 2022-10-02 ENCOUNTER — Telehealth: Payer: Self-pay | Admitting: Gastroenterology

## 2022-10-02 NOTE — Telephone Encounter (Signed)
This is unfortunate. Patient had been on the schedule for an EGD and colonoscopy, the latter of which was supposed to be a double prep. So, she should have started her prep yesterday. I understand if insurance is the issue but should have checked back on this more than 24 hours prior to her procedure. Unfortunately she will need to be charged a late cancellation fee. [facility] if you can process. I am not sure if someone had looked at her insurance to see if she was approved, and why the patient was not notified earlier. Thanks

## 2022-10-02 NOTE — Telephone Encounter (Signed)
Good Morning Dr Adela Lank  Inbound call from patient canceling procedure due to her not hearing back from anyone  to know if she was approved for procedure through her insurance. Patient had a procedure for 10/03/22.  Patient did not reschedule.

## 2022-10-03 ENCOUNTER — Encounter: Payer: Medicaid Other | Admitting: Gastroenterology

## 2022-11-18 ENCOUNTER — Other Ambulatory Visit: Payer: Self-pay

## 2022-11-18 MED ORDER — BACLOFEN 10 MG PO TABS: 10.0000 mg | ORAL_TABLET | Freq: Every day | ORAL | 1 refills | Status: AC

## 2022-11-23 ENCOUNTER — Encounter (HOSPITAL_COMMUNITY): Payer: Self-pay | Admitting: Emergency Medicine

## 2022-11-23 ENCOUNTER — Ambulatory Visit (HOSPITAL_COMMUNITY)
Admission: EM | Admit: 2022-11-23 | Discharge: 2022-11-23 | Disposition: A | Discharge status: Home / Self Care | Payer: Medicaid Other | Attending: Emergency Medicine | Admitting: Emergency Medicine

## 2022-11-23 ENCOUNTER — Ambulatory Visit (INDEPENDENT_AMBULATORY_CARE_PROVIDER_SITE_OTHER): Payer: Medicaid Other

## 2022-11-23 DIAGNOSIS — S99921A Unspecified injury of right foot, initial encounter: Secondary | ICD-10-CM | POA: Diagnosis not present

## 2022-11-23 DIAGNOSIS — M62838 Other muscle spasm: Secondary | ICD-10-CM

## 2022-11-23 MED ORDER — ACETAMINOPHEN 500 MG PO TABS: 1000.0000 mg | ORAL_TABLET | Freq: Three times a day (TID) | ORAL | 0 refills | Status: AC | PRN

## 2022-11-23 MED ORDER — ACETAMINOPHEN 325 MG PO TABS
ORAL_TABLET | ORAL | Status: AC
  Filled 2022-11-23: qty 3

## 2022-11-23 MED ORDER — METHYLPREDNISOLONE 4 MG PO TBPK: ORAL_TABLET | ORAL | 0 refills | Status: DC

## 2022-11-23 MED ORDER — DICLOFENAC SODIUM 1 % EX GEL: 4.0000 g | Freq: Four times a day (QID) | CUTANEOUS | 2 refills | Status: AC

## 2022-11-23 MED ORDER — METHYLPREDNISOLONE ACETATE 80 MG/ML IJ SUSP
60.0000 mg | Freq: Once | INTRAMUSCULAR | Status: AC
  Administered 2022-11-23: 60 mg via INTRAMUSCULAR

## 2022-11-23 MED ORDER — METHYLPREDNISOLONE ACETATE 80 MG/ML IJ SUSP
INTRAMUSCULAR | Status: AC
  Filled 2022-11-23: qty 1

## 2022-11-23 MED ORDER — ACETAMINOPHEN 325 MG PO TABS
975.0000 mg | ORAL_TABLET | Freq: Once | ORAL | Status: AC
  Administered 2022-11-23: 975 mg via ORAL

## 2022-11-23 NOTE — Discharge Instructions (Addendum)
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  The x-ray of your right shoulder was completed normal.   The mainstay of therapy for musculoskeletal pain is reduction of inflammation and relaxation of tension which is causing inflammation.  Keep in mind, pain always begets more pain.  To help you stay ahead of your pain and inflammation, I have provided the following regimen for you:   During your visit today, you received an injection of a high-dose steroidal anti-inflammatory medication that should significantly reduce your pain for the next 6 to 8 hours.   You were also provided with Tylenol (acetaminophen) 975 mg.  Please begin taking Tylenol 975 mg every 8 hours to keep your pain well controlled. Please know that It is safe to take a maximum 3000 mg of Tylenol in a 24-hour period.  Please do not exceed this amount.  Tylenol works best when taken on a scheduled basis.   This evening, you can begin taking baclofen 20 mg.  This is a highly effective muscle relaxer and antispasmodic which should continue to provide you with relaxation of your tense muscles, allow you to sleep well and to keep your pain under control.  You can continue taking this medication 3 times daily as you need to.  If you find that this medication makes you too sleepy, you can break them in half for your daytime doses and, if needed double them for your nighttime dose.  Do not take more than 30 mg of baclofen in a 24-hour period.   Tomorrow morning, please begin taking methylprednisolone.  Please take 1 full row tablets at once with your breakfast meal.  This will continue to keep your inflammation under control until your body can heal.   You are welcome to use topical anti-inflammatory creams such as Voltaren gel, capsaicin or Aspercreme as recommended.  These medications are available over-the-counter,  please follow manufactures instructions for use.  As a courtesy, I provided you with a prescription for diclofenac in the event that your insurance will pay for this.   Please visit this website for tips and tricks on how to use this myofacial release technique to relieve muscle spasm and back pain: https://www.[employer]uneupfitness.com/blog/self-myofascial-release-techniques-using-massage-balls   https://www.[employer]uneupfitness.com/blog/self-myofascial-release-techniques-using-massage-balls   Please consider discussing referral to physical therapy with your primary care provider.  Physical therapist are very good at teasing out the underlying cause of acute musculoskeletal pain and helping with prevention of future recurrences.   I also recommend that you remain out of work for the next several days, I provided you with a note to return to work in 3 days.  If you feel that you need this time extended, please follow-up with your primary care provider or return to urgent care for reevaluation so that we can provide you with a note for another 3 days.   Thank you for visiting [facility] today.  We appreciate the opportunity to participate in your care.

## 2022-11-23 NOTE — ED Provider Notes (Signed)
[facility]    CSN: [id number] Arrival date & time: 11/23/22  1232    HISTORY   Chief Complaint  Patient presents with   Shoulder Pain   HPI Sherry Cook is a pleasant, 62 y.o. female who presents to urgent care today. Patient complains of intermittent flareups of right shoulder pain after injuring it in 2023.  Patient states for the past 3 weeks pain has gotten progressively worse and that pain is now radiating down her right arm to the tips of her fingers.  Patient states she is also having pain in her neck and mid back.  Patient states has been taking ibuprofen without meaningful relief.  Patient denies loss of range of motion of right arm, right shoulder, neck.  Of note, patient states she tolerates over-the-counter brown and dye free ibuprofen well, only has an allergic reaction to the orange coating on some versions of ibuprofen.  The history is provided by the patient.   Past Medical History:  Diagnosis Date   Allergic rhinitis    Depression    Diabetes mellitus without complication (HCC)    GERD (gastroesophageal reflux disease)    History of migraine headaches    Hyperlipidemia    Hypertension    Patient Active Problem List   Diagnosis Date Noted   Screening breast examination 02/03/2019   Past Surgical History:  Procedure Laterality Date   ABDOMINAL HYSTERECTOMY  10/2004   partial   ECTOPIC PREGNANCY SURGERY     TUBAL LIGATION     OB History     Gravida  3   Para      Term      Preterm      AB  1   Living  2      SAB      IAB      Ectopic  1   Multiple      Live Births  2          Home Medications    Prior to Admission medications   Medication Sig Start Date End Date Taking? Authorizing Provider  amLODipine (NORVASC) 10 MG tablet Take 10 mg by mouth daily.    [provider]  baclofen (LIORESAL) 10 MG tablet Take 1 tablet (10 mg total) by mouth at bedtime. 11/18/22   Armbruster, Willaim Rayas, MD  carvedilol  (COREG) 25 MG tablet Take 1 tablet (25 mg total) by mouth 2 (two) times daily with a meal. 04/04/15   Ozella Rocks, MD  cetirizine (ZYRTEC) 10 MG tablet Take 10 mg by mouth at bedtime as needed for allergies.    [provider]  esomeprazole (NEXIUM) 40 MG capsule Take 40 mg by mouth daily. 08/04/22   [provider]  FLUoxetine (PROZAC) 20 MG capsule Take 20 mg by mouth daily.    [provider]  losartan-hydrochlorothiazide (HYZAAR) 50-12.5 MG tablet Take 1 tablet by mouth daily. 08/18/22   Armbruster, Willaim Rayas, MD  Sodium Sulfate-Mag Sulfate-KCl (SUTAB) [phone] MG TABS Use as directed for colonoscopy. MANUFACTURER CODES!! BIN: [id number] PCN: [id number]: [id number] MEMBER ID: [id number];[employer] AS SECONDARY INSURANCE ;NO PRIOR AUTHORIZATION 08/22/22   Armbruster, Willaim Rayas, MD  topiramate (TOPAMAX) 50 MG tablet Take 50 mg by mouth daily. 08/04/22   [provider]    Family History Family History  Problem Relation Age of Onset   Parkinson's disease Mother    Asthma Father    Diabetes Sister    Hypertension Sister  Breast cancer Sister    Diabetes Brother    Hypertension Brother    Social History Social History   Tobacco Use   Smoking status: Never   Smokeless tobacco: Never  Vaping Use   Vaping Use: Never used  Substance Use Topics   Alcohol use: No   Drug use: No   Allergies   Ibuprofen  Review of Systems Review of Systems Pertinent findings revealed after performing a 14 point review of systems has been noted in the history of present illness.  Physical Exam Vital Signs BP (!) 133/91 (BP Location: Left Arm)   Pulse 90   Temp 98.4 F (36.9 C) (Oral)   Resp 18   SpO2 97%   No data found.  Physical Exam Vitals and nursing note reviewed.  Constitutional:      General: She is not in acute distress.    Appearance: Normal appearance.  HENT:     Head: Normocephalic and atraumatic.  Eyes:     Pupils: Pupils are equal, round, and  reactive to light.  Cardiovascular:     Rate and Rhythm: Normal rate and regular rhythm.  Pulmonary:     Effort: Pulmonary effort is normal.     Breath sounds: Normal breath sounds.  Musculoskeletal:        General: Normal range of motion.     Cervical back: Normal range of motion and neck supple. Spasms and tenderness present. Normal range of motion.  Skin:    General: Skin is warm and dry.  Neurological:     General: No focal deficit present.     Mental Status: She is alert and oriented to person, place, and time. Mental status is at baseline.  Psychiatric:        Mood and Affect: Mood normal.        Behavior: Behavior normal.        Thought Content: Thought content normal.        Judgment: Judgment normal.     UC Couse / Diagnostics / Procedures:     Radiology DG Shoulder Right  Result Date: 11/23/2022 CLINICAL DATA:  Worsening right shoulder pain for 3 weeks. EXAM: RIGHT SHOULDER - 2+ VIEW COMPARISON:  Chest radiograph dated 05/02/2020. FINDINGS: There is no evidence of fracture or dislocation. There is no evidence of arthropathy or other focal bone abnormality. Soft tissues are unremarkable. IMPRESSION: Negative. Electronically Signed   By: Romona Curls M.D.   On: 11/23/2022 14:17   DG Cervical Spine Complete  Result Date: 11/23/2022 CLINICAL DATA:  Neck pain and right shoulder pain EXAM: CERVICAL SPINE - COMPLETE 4+ VIEW COMPARISON:  None Available. FINDINGS: There is no evidence of cervical spine fracture or prevertebral soft tissue swelling. Alignment is normal. Intervertebral disc heights are relatively well preserved. Minimal facet arthropathy. No bony foraminal narrowing is evident on oblique views. IMPRESSION: Minimal degenerative changes of the cervical spine. No acute findings. Electronically Signed   By: Duanne Guess D.O.   On: 11/23/2022 14:16    Procedures Procedures (including critical care time) EKG  Pending results:  Labs Reviewed - No data to  display  Medications Ordered in UC: Medications  acetaminophen (TYLENOL) tablet 975 mg (975 mg Oral Given 11/23/22 1429)  methylPREDNISolone acetate (DEPO-MEDROL) injection 60 mg (60 mg Intramuscular Given 11/23/22 1429)    UC Diagnoses / Final Clinical Impressions(s)   I have reviewed the triage vital signs and the nursing notes.  Pertinent labs & imaging results that were available during my  care of the patient were reviewed by me and considered in my medical decision making (see chart for details).    Final diagnoses:  Cervical paraspinous muscle spasm    Patient advised of x-ray findings. Patient was provided with an injection of steroid during their visit today for acute pain relief. Patient was advised to: Begin Medrol dose pack Take muscle relaxer 3 times daily (Patient has been advised that if this makes them sleepy, they can just take this at bedtime, up to 20 mg per dose, and try breaking the tablets in half or 5 mg per dose during the day) Begin acetaminophen 1000 mg 3 times daily on a scheduled basis.   Apply topical Voltaren gel 4 times daily as needed Consider physical therapy, chiropractic care, orthopedic follow-up Avoid stretching or strengthening exercises until pain is completely resolved Patient education handout provided for self-care at home. Return precautions advised  Please see discharge instructions below for details of plan of care as provided to patient. ED Prescriptions     Medication Sig Dispense Auth. Provider   methylPREDNISolone (MEDROL DOSEPAK) 4 MG TBPK tablet Take 24 mg on day 1, 20 mg on day 2, 16 mg on day 3, 12 mg on day 4, 8 mg on day 5, 4 mg on day 6.  Take all tablets in each row at once, do not spread tablets out throughout the day. 21 tablet Theadora Rama Scales, PA-C   acetaminophen (TYLENOL) 500 MG tablet Take 2 tablets (1,000 mg total) by mouth every 8 (eight) hours as needed for up to 30 doses for mild pain or fever. 60 tablet Theadora Rama Scales, PA-C   diclofenac Sodium (VOLTAREN) 1 % GEL Apply 4 g topically 4 (four) times daily. Apply to affected areas 4 times daily as needed for pain. 100 g Theadora Rama Scales, PA-C      PDMP not reviewed this encounter.  Discharge Instructions:   Discharge Instructions      The results of the x-ray that we performed during your visit today showed mild degenerative arthritis in your cervical spine which is common in anyone over the age of 31.  The x-ray of your right shoulder was completed normal.   The mainstay of therapy for musculoskeletal pain is reduction of inflammation and relaxation of tension which is causing inflammation.  Keep in mind, pain always begets more pain.  To help you stay ahead of your pain and inflammation, I have provided the following regimen for you:   During your visit today, you received an injection of a high-dose steroidal anti-inflammatory medication that should significantly reduce your pain for the next 6 to 8 hours.   You were also provided with Tylenol (acetaminophen) 975 mg.  Please begin taking Tylenol 975 mg every 8 hours to keep your pain well controlled. Please know that It is safe to take a maximum 3000 mg of Tylenol in a 24-hour period.  Please do not exceed this amount.  Tylenol works best when taken on a scheduled basis.   This evening, you can begin taking baclofen 20 mg.  This is a highly effective muscle relaxer and antispasmodic which should continue to provide you with relaxation of your tense muscles, allow you to sleep well and to keep your pain under control.  You can continue taking this medication 3 times daily as you need to.  If you find that this medication makes you too sleepy, you can break them in half for your daytime doses and, if  needed double them for your nighttime dose.  Do not take more than 30 mg of baclofen in a 24-hour period.   Tomorrow morning, please begin taking methylprednisolone.  Please take 1 full row  tablets at once with your breakfast meal.  This will continue to keep your inflammation under control until your body can heal.   You are welcome to use topical anti-inflammatory creams such as Voltaren gel, capsaicin or Aspercreme as recommended.  These medications are available over-the-counter, please follow manufactures instructions for use.  As a courtesy, I provided you with a prescription for diclofenac in the event that your insurance will pay for this.   Please visit this website for tips and tricks on how to use this myofacial release technique to relieve muscle spasm and back pain: https://www.tuneupfitness.com/blog/self-myofascial-release-techniques-using-massage-balls   https://www.tuneupfitness.com/blog/self-myofascial-release-techniques-using-massage-balls   Please consider discussing referral to physical therapy with your primary care provider.  Physical therapist are very good at teasing out the underlying cause of acute musculoskeletal pain and helping with prevention of future recurrences.   I also recommend that you remain out of work for the next several days, I provided you with a note to return to work in 3 days.  If you feel that you need this time extended, please follow-up with your primary care provider or return to urgent care for reevaluation so that we can provide you with a note for another 3 days.   Thank you for visiting [facility] today.  We appreciate the opportunity to participate in your care.       Disposition Upon Discharge:  Condition: stable for discharge home Home: take medications as prescribed; routine discharge instructions as discussed; follow up as advised.  Patient presented with an acute illness with associated systemic symptoms and significant discomfort requiring urgent management. In my opinion, this is a condition that a prudent lay person (someone who possesses an average knowledge of health and medicine) may potentially expect  to result in complications if not addressed urgently such as respiratory distress, impairment of bodily function or dysfunction of bodily organs.   Routine symptom specific, illness specific and/or disease specific instructions were discussed with the patient and/or caregiver at length.   As such, the patient has been evaluated and assessed, work-up was performed and treatment was provided in alignment with urgent care protocols and evidence based medicine.  Patient/parent/caregiver has been advised that the patient may require follow up for further testing and treatment if the symptoms continue in spite of treatment, as clinically indicated and appropriate.  Patient/parent/caregiver has been advised to report to orthopedic urgent care clinic or return to the [facility] or PCP in 3-5 days if no better; follow-up with orthopedics, PCP or the Emergency Department if new signs and symptoms develop or if the current signs or symptoms continue to change or worsen for further workup, evaluation and treatment as clinically indicated and appropriate  The patient will follow up with their current PCP if and as advised. If the patient does not currently have a PCP we will have assisted them in obtaining one.   The patient may need specialty follow up if the symptoms continue, in spite of conservative treatment and management, for further workup, evaluation, consultation and treatment as clinically indicated and appropriate.  Patient/parent/caregiver verbalized understanding and agreement of plan as discussed.  All questions were addressed during visit.  Please see discharge instructions below for further details of plan.  This office note has been dictated using Teaching laboratory technician.  Unfortunately, this method of dictation can sometimes lead to typographical or grammatical errors.  I apologize for your inconvenience in advance if this occurs.  Please do not hesitate to reach out to me if clarification is  needed.      Theadora Rama Scales, PA-C 11/23/22 1513

## 2022-11-23 NOTE — ED Triage Notes (Signed)
Pt reports injured right shoulder in 2023. Reports will have flares up intermittently. For about 3 weeks having right shoulder pain that is radiating down arm to fingers as well as neck and mid back. Reports taking Ibuprofen.

## 2022-11-27 ENCOUNTER — Ambulatory Visit (HOSPITAL_COMMUNITY)
Admission: EM | Admit: 2022-11-27 | Discharge: 2022-11-27 | Disposition: A | Discharge status: Home / Self Care | Payer: Medicaid Other

## 2022-11-27 ENCOUNTER — Encounter (HOSPITAL_COMMUNITY): Payer: Self-pay

## 2022-11-27 DIAGNOSIS — R030 Elevated blood-pressure reading, without diagnosis of hypertension: Secondary | ICD-10-CM | POA: Diagnosis not present

## 2022-11-27 DIAGNOSIS — Z8679 Personal history of other diseases of the circulatory system: Secondary | ICD-10-CM | POA: Diagnosis not present

## 2022-11-27 DIAGNOSIS — M5412 Radiculopathy, cervical region: Secondary | ICD-10-CM | POA: Diagnosis not present

## 2022-11-27 NOTE — ED Triage Notes (Signed)
Pt states she is having right shoulder pain radiating down her right arm for the past 3 weeks.  Seen here on 11/23/2022 for the same.  States the pain is worse. Has been taking the prescribed meds with no relief.

## 2022-11-27 NOTE — ED Provider Notes (Signed)
[facility]    CSN: [id number] Arrival date & time: 11/27/22  1256      History   Chief Complaint Chief Complaint  Patient presents with   Shoulder Pain    HPI Tranese Delawder is a 62 y.o. female.   62 year old female, Gwendalyn Capone, presents to urgent care Pt was seen here 06/16 for same advised to follow up with PCP/Orthopedics/physical therapy,pt has not followed up here for FMLA forms to be completed.  Patient reports pain radiates from her right shoulder down to her right fingers, patient denies any fall or trauma states she uses computer and types a lot at work.  Patient states she has been taking muscle relaxers and Voltaren and has made pain worse.  Patient received a steroid injection on 6/16 without relief.  The history is provided by the patient. No language interpreter was used.    Past Medical History:  Diagnosis Date   Allergic rhinitis    Depression    Diabetes mellitus without complication (HCC)    GERD (gastroesophageal reflux disease)    History of migraine headaches    Hyperlipidemia    Hypertension     Patient Active Problem List   Diagnosis Date Noted   Cervical radiculopathy 11/27/2022   History of hypertension 11/27/2022   Elevated blood pressure reading 11/27/2022   Screening breast examination 02/03/2019    Past Surgical History:  Procedure Laterality Date   ABDOMINAL HYSTERECTOMY  10/2004   partial   ECTOPIC PREGNANCY SURGERY     TUBAL LIGATION      OB History     Gravida  3   Para      Term      Preterm      AB  1   Living  2      SAB      IAB      Ectopic  1   Multiple      Live Births  2            Home Medications    Prior to Admission medications   Medication Sig Start Date End Date Taking? Authorizing Provider  acetaminophen (TYLENOL) 500 MG tablet Take 2 tablets (1,000 mg total) by mouth every 8 (eight) hours as needed for up to 30 doses for mild pain or fever. 11/23/22   Theadora Rama Scales, PA-C  amLODipine (NORVASC) 10 MG tablet Take 10 mg by mouth daily.    [provider]  baclofen (LIORESAL) 10 MG tablet Take 1 tablet (10 mg total) by mouth at bedtime. 11/18/22   Armbruster, Willaim Rayas, MD  carvedilol (COREG) 25 MG tablet Take 1 tablet (25 mg total) by mouth 2 (two) times daily with a meal. 04/04/15   Ozella Rocks, MD  cetirizine (ZYRTEC) 10 MG tablet Take 10 mg by mouth at bedtime as needed for allergies.    [provider]  diclofenac Sodium (VOLTAREN) 1 % GEL Apply 4 g topically 4 (four) times daily. Apply to affected areas 4 times daily as needed for pain. 11/23/22   Theadora Rama Scales, PA-C  esomeprazole (NEXIUM) 40 MG capsule Take 40 mg by mouth daily. 08/04/22   [provider]  FLUoxetine (PROZAC) 20 MG capsule Take 20 mg by mouth daily.    [provider]  losartan-hydrochlorothiazide (HYZAAR) 50-12.5 MG tablet Take 1 tablet by mouth daily. 08/18/22   Armbruster, Willaim Rayas, MD  methylPREDNISolone (MEDROL DOSEPAK) 4 MG TBPK tablet Take 24  mg on day 1, 20 mg on day 2, 16 mg on day 3, 12 mg on day 4, 8 mg on day 5, 4 mg on day 6.  Take all tablets in each row at once, do not spread tablets out throughout the day. 11/23/22   Theadora Rama Scales, PA-C  Sodium Sulfate-Mag Sulfate-KCl (SUTAB) [phone] MG TABS Use as directed for colonoscopy. MANUFACTURER CODES!! BIN: [id number] PCN: CN GROUP: [id number] MEMBER ID: [id number];[employer] AS SECONDARY INSURANCE ;NO PRIOR AUTHORIZATION 08/22/22   Armbruster, Willaim Rayas, MD  topiramate (TOPAMAX) 50 MG tablet Take 50 mg by mouth daily. 08/04/22   [provider]    Family History Family History  Problem Relation Age of Onset   Parkinson's disease Mother    Asthma Father    Diabetes Sister    Hypertension Sister    Breast cancer Sister    Diabetes Brother    Hypertension Brother     Social History Social History   Tobacco Use   Smoking status: Never   Smokeless tobacco:  Never  Vaping Use   Vaping Use: Never used  Substance Use Topics   Alcohol use: No   Drug use: No     Allergies   Ibuprofen   Review of Systems Review of Systems  Constitutional:  Negative for fever.  Cardiovascular:  Negative for chest pain, palpitations and leg swelling.  Musculoskeletal:  Positive for arthralgias and myalgias.  Skin: Negative.   All other systems reviewed and are negative.    Physical Exam Triage Vital Signs ED Triage Vitals  Enc Vitals Group     BP 11/27/22 1403 (!) 152/90     Pulse Rate 11/27/22 1403 92     Resp 11/27/22 1403 16     Temp 11/27/22 1403 98.5 F (36.9 C)     Temp Source 11/27/22 1403 Oral     SpO2 11/27/22 1403 97 %     Weight --      Height --      Head Circumference --      Peak Flow --      Pain Score 11/27/22 1404 4     Pain Loc --      Pain Edu? --      Excl. in GC? --    No data found.  Updated Vital Signs BP (!) 152/90 (BP Location: Left Arm)   Pulse 92   Temp 98.5 F (36.9 C) (Oral)   Resp 16   SpO2 97%   Visual Acuity Right Eye Distance:   Left Eye Distance:   Bilateral Distance:    Right Eye Near:   Left Eye Near:    Bilateral Near:     Physical Exam Vitals and nursing note reviewed.  Constitutional:      General: She is not in acute distress.    Appearance: She is well-developed and well-groomed.  HENT:     Head: Normocephalic and atraumatic.  Eyes:     Conjunctiva/sclera: Conjunctivae normal.  Neck:     Trachea: Trachea normal.  Cardiovascular:     Rate and Rhythm: Normal rate and regular rhythm.     Heart sounds: No murmur heard. Pulmonary:     Effort: Pulmonary effort is normal. No respiratory distress.     Breath sounds: Normal breath sounds.  Abdominal:     Palpations: Abdomen is soft.     Tenderness: There is no abdominal tenderness.  Musculoskeletal:        General: No swelling.  Arms:     Cervical back: Normal range of motion and neck supple.  Skin:    General: Skin is  warm and dry.     Capillary Refill: Capillary refill takes less than 2 seconds.  Neurological:     General: No focal deficit present.     Mental Status: She is alert and oriented to person, place, and time.     GCS: GCS eye subscore is 4. GCS verbal subscore is 5. GCS motor subscore is 6.     Cranial Nerves: Cranial nerves 2-12 are intact.     Sensory: Sensation is intact.     Motor: Motor function is intact.     Coordination: Coordination is intact.     Gait: Gait is intact.  Psychiatric:        Attention and Perception: Attention normal.        Mood and Affect: Mood normal.        Speech: Speech normal.        Behavior: Behavior normal. Behavior is cooperative.      UC Treatments / Results  Labs (all labs ordered are listed, but only abnormal results are displayed) Labs Reviewed - No data to display  EKG   Radiology No results found.  Procedures Procedures (including critical care time)  Medications Ordered in UC Medications - No data to display  Initial Impression / Assessment and Plan / UC Course  I have reviewed the triage vital signs and the nursing notes.  Pertinent labs & imaging results that were available during my care of the patient were reviewed by me and considered in my medical decision making (see chart for details).    Discussed exam findings and plan of care with patient and patient verbalized understanding to this provider.  Patient is aware that we are unable to complete FMLA forms, will need to follow-up with her PCP and or orthopedic specialist.  Ddx: Cervical radiculopathy, elevated blood pressure, history of hypertension Final Clinical Impressions(s) / UC Diagnoses   Final diagnoses:  Cervical radiculopathy  History of hypertension  Elevated blood pressure reading     Discharge Instructions      If the muscle relaxer is making your symptoms worse or causing you to be too drowsy stop taking it.  Follow up with PCP/Orthopedist,  unfortunately we do not complete FMLA forms.  May take acetaminophen 1000 mg 3 times daily on a scheduled basis for pain. Try lidocaine batch or biofreeze to area as label directed(these are over the counter),stop voltaren if it makes you feel worse). Consider physical therapy, orthopedic follow-up-may need referral from PCP. May try [facility] for walk in/quick appt turn around times.       ED Prescriptions   None    PDMP not reviewed this encounter.   Clancy Gourd, NP 11/27/22 [phone]

## 2022-11-27 NOTE — Discharge Instructions (Addendum)
If the muscle relaxer is making your symptoms worse or causing you to be too drowsy stop taking it.  Follow up with PCP/Orthopedist, unfortunately we do not complete FMLA forms.  May take acetaminophen 1000 mg 3 times daily on a scheduled basis for pain. Try lidocaine batch or biofreeze to area as label directed(these are over the counter),stop voltaren if it makes you feel worse). Consider physical therapy, orthopedic follow-up-may need referral from PCP. May try [facility] for walk in/quick appt turn around times.

## 2023-04-19 ENCOUNTER — Emergency Department (HOSPITAL_COMMUNITY): Payer: No Typology Code available for payment source

## 2023-04-19 ENCOUNTER — Other Ambulatory Visit: Payer: Self-pay

## 2023-04-19 ENCOUNTER — Emergency Department (HOSPITAL_COMMUNITY)
Admission: EM | Admit: 2023-04-19 | Discharge: 2023-04-19 | Disposition: A | Discharge status: Home / Self Care | Payer: No Typology Code available for payment source | Attending: Emergency Medicine | Admitting: Emergency Medicine

## 2023-04-19 ENCOUNTER — Encounter (HOSPITAL_COMMUNITY): Payer: Self-pay

## 2023-04-19 DIAGNOSIS — Z91040 Latex allergy status: Secondary | ICD-10-CM | POA: Diagnosis not present

## 2023-04-19 DIAGNOSIS — K5900 Constipation, unspecified: Secondary | ICD-10-CM | POA: Diagnosis present

## 2023-04-19 DIAGNOSIS — Z91010 Allergy to peanuts: Secondary | ICD-10-CM | POA: Diagnosis not present

## 2023-04-19 LAB — URINALYSIS, ROUTINE W REFLEX MICROSCOPIC
Bilirubin Urine: NEGATIVE
Glucose, UA: NEGATIVE mg/dL
Hgb urine dipstick: NEGATIVE
Ketones, ur: NEGATIVE mg/dL
Nitrite: NEGATIVE
Protein, ur: NEGATIVE mg/dL
Specific Gravity, Urine: 1.026 (ref 1.005–1.030)
pH: 6 (ref 5.0–8.0)

## 2023-04-19 LAB — COMPREHENSIVE METABOLIC PANEL
ALT: 22 U/L (ref 0–44)
AST: 22 U/L (ref 15–41)
Albumin: 3.4 g/dL — ABNORMAL LOW (ref 3.5–5.0)
Alkaline Phosphatase: 46 U/L (ref 38–126)
Anion gap: 12 (ref 5–15)
BUN: 21 mg/dL — ABNORMAL HIGH (ref 6–20)
CO2: 17 mmol/L — ABNORMAL LOW (ref 22–32)
Calcium: 9.1 mg/dL (ref 8.9–10.3)
Chloride: 110 mmol/L (ref 98–111)
Creatinine, Ser: 0.79 mg/dL (ref 0.44–1.00)
GFR, Estimated: >60 mL/min (ref >60)
Glucose, Bld: 141 mg/dL — ABNORMAL HIGH (ref 70–99)
Potassium: 3.3 mmol/L — ABNORMAL LOW (ref 3.5–5.1)
Sodium: 139 mmol/L (ref 135–145)
Total Bilirubin: 0.7 mg/dL (ref <1.2)
Total Protein: 6.7 g/dL (ref 6.5–8.1)

## 2023-04-19 LAB — CBC
HCT: 35.7 % — ABNORMAL LOW (ref 36.0–46.0)
Hemoglobin: 11.4 g/dL — ABNORMAL LOW (ref 12.0–15.0)
MCH: 26.3 pg (ref 26.0–34.0)
MCHC: 31.9 g/dL (ref 30.0–36.0)
MCV: 82.3 fL (ref 80.0–100.0)
Platelets: 205 10*3/uL (ref 150–400)
RBC: 4.34 MIL/uL (ref 3.87–5.11)
RDW: 13.5 % (ref 11.5–15.5)
WBC: 5.9 10*3/uL (ref 4.0–10.5)
nRBC: 0 % (ref 0.0–0.2)

## 2023-04-19 LAB — LIPASE, BLOOD: Lipase: 67 U/L — ABNORMAL HIGH (ref 11–51)

## 2023-04-19 MED ORDER — POLYETHYLENE GLYCOL 3350 17 G PO PACK: 17.0000 g | PACK | Freq: Two times a day (BID) | ORAL | 0 refills | Status: AC

## 2023-04-19 MED ORDER — DICYCLOMINE HCL 20 MG PO TABS: 20.0000 mg | ORAL_TABLET | Freq: Two times a day (BID) | ORAL | 0 refills | Status: DC

## 2023-04-19 NOTE — ED Notes (Signed)
Called pt name x1

## 2023-04-19 NOTE — Discharge Instructions (Addendum)
Take the medications to help with your constipation symptoms.  Follow up with a GI doctor for further evaluation.

## 2023-04-19 NOTE — ED Provider Notes (Signed)
[place] EMERGENCY DEPARTMENT AT [facility] Provider Note   CSN: [id number] Arrival date & time: 04/19/23  1207     History  Chief Complaint  Patient presents with   Arm Pain   Constipation    Sherry Cook is a 62 y.o. female.   Arm Pain  Constipation  Pt states she has been having trouble with constipation since August.   She has been only going every 5 days or so.  Pt states she then had trouble with abd pain, fever and sweating about a month ago.   Those symptoms resolved but she still as not had a normal bowel movement.  She has been trying senna and castor oil.  SHe also tried magnesium and a fleet enema on the 24th.   Pt was travelling and had bowel movements then but got back last night and has been feeling lousy.  SHe has not seen anyone for this in the last several months.    Home Medications Prior to Admission medications   Medication Sig Start Date End Date Taking? Authorizing Provider  dicyclomine (BENTYL) 20 MG tablet Take 1 tablet (20 mg total) by mouth 2 (two) times daily. 04/19/23  Yes Linwood Dibbles, MD  polyethylene glycol (MIRALAX) 17 g packet Take 17 g by mouth 2 (two) times daily. 04/19/23  Yes Linwood Dibbles, MD  acetaminophen (TYLENOL) 500 MG tablet Take 2 tablets (1,000 mg total) by mouth every 8 (eight) hours as needed for up to 30 doses for mild pain or fever. 11/23/22   Theadora Rama Scales, PA-C  amLODipine (NORVASC) 10 MG tablet Take 10 mg by mouth daily.    [provider]  baclofen (LIORESAL) 10 MG tablet Take 1 tablet (10 mg total) by mouth at bedtime. 11/18/22   Armbruster, Willaim Rayas, MD  carvedilol (COREG) 25 MG tablet Take 1 tablet (25 mg total) by mouth 2 (two) times daily with a meal. 04/04/15   Ozella Rocks, MD  cetirizine (ZYRTEC) 10 MG tablet Take 10 mg by mouth at bedtime as needed for allergies.    [provider]  diclofenac Sodium (VOLTAREN) 1 % GEL Apply 4 g topically 4 (four) times daily. Apply to  affected areas 4 times daily as needed for pain. 11/23/22   Theadora Rama Scales, PA-C  esomeprazole (NEXIUM) 40 MG capsule Take 40 mg by mouth daily. 08/04/22   [provider]  FLUoxetine (PROZAC) 20 MG capsule Take 20 mg by mouth daily.    [provider]  losartan-hydrochlorothiazide (HYZAAR) 50-12.5 MG tablet Take 1 tablet by mouth daily. 08/18/22   Armbruster, Willaim Rayas, MD  methylPREDNISolone (MEDROL DOSEPAK) 4 MG TBPK tablet Take 24 mg on day 1, 20 mg on day 2, 16 mg on day 3, 12 mg on day 4, 8 mg on day 5, 4 mg on day 6.  Take all tablets in each row at once, do not spread tablets out throughout the day. 11/23/22   Theadora Rama Scales, PA-C  Sodium Sulfate-Mag Sulfate-KCl (SUTAB) [phone] MG TABS Use as directed for colonoscopy. MANUFACTURER CODES!! BIN: [id number] PCN: CN GROUP: [id number] MEMBER ID: [id number];[employer] AS SECONDARY INSURANCE ;NO PRIOR AUTHORIZATION 08/22/22   Armbruster, Willaim Rayas, MD  topiramate (TOPAMAX) 50 MG tablet Take 50 mg by mouth daily. 08/04/22   [provider]      Allergies    Ibuprofen, Latex, and Peanut-containing drug products    Review of Systems   Review of Systems  Gastrointestinal:  Positive for  constipation.    Physical Exam Updated Vital Signs BP 124/84   Pulse 79   Temp 97.8 F (36.6 C) (Oral)   Resp 18   Ht 1.524 m (5')   Wt 85.7 kg   SpO2 100%   BMI 36.91 kg/m  Physical Exam Vitals and nursing note reviewed.  Constitutional:      General: She is not in acute distress.    Appearance: She is well-developed.  HENT:     Head: Normocephalic and atraumatic.     Right Ear: External ear normal.     Left Ear: External ear normal.  Eyes:     General: No scleral icterus.       Right eye: No discharge.        Left eye: No discharge.     Conjunctiva/sclera: Conjunctivae normal.  Neck:     Trachea: No tracheal deviation.  Cardiovascular:     Rate and Rhythm: Normal rate and regular rhythm.  Pulmonary:      Effort: Pulmonary effort is normal. No respiratory distress.     Breath sounds: Normal breath sounds. No stridor. No wheezing or rales.  Abdominal:     General: Bowel sounds are normal. There is no distension.     Palpations: Abdomen is soft.     Tenderness: There is no abdominal tenderness. There is no guarding or rebound.  Musculoskeletal:        General: No tenderness or deformity.     Cervical back: Neck supple.  Skin:    General: Skin is warm and dry.     Findings: No rash.  Neurological:     General: No focal deficit present.     Mental Status: She is alert.     Cranial Nerves: No cranial nerve deficit, dysarthria or facial asymmetry.     Sensory: No sensory deficit.     Motor: No abnormal muscle tone or seizure activity.     Coordination: Coordination normal.  Psychiatric:        Mood and Affect: Mood normal.     ED Results / Procedures / Treatments   Labs (all labs ordered are listed, but only abnormal results are displayed) Labs Reviewed  LIPASE, BLOOD - Abnormal; Notable for the following components:      Result Value   Lipase 67 (*)    All other components within normal limits  COMPREHENSIVE METABOLIC PANEL - Abnormal; Notable for the following components:   Potassium 3.3 (*)    CO2 17 (*)    Glucose, Bld 141 (*)    BUN 21 (*)    Albumin 3.4 (*)    All other components within normal limits  URINALYSIS, ROUTINE W REFLEX MICROSCOPIC - Abnormal; Notable for the following components:   Leukocytes,Ua SMALL (*)    Bacteria, UA RARE (*)    All other components within normal limits  CBC - Abnormal; Notable for the following components:   Hemoglobin 11.4 (*)    HCT 35.7 (*)    All other components within normal limits    EKG None  Radiology DG Abdomen 1 View  Result Date: 04/19/2023 CLINICAL DATA:  Constipation EXAM: ABDOMEN - 1 VIEW COMPARISON:  None Available. FINDINGS: 2 supine views of the abdomen and pelvis . No bowel obstruction or free intraperitoneal  air. No abnormal abdominal calcifications. No appendicolith. Distal gas and stool. IMPRESSION: No acute findings. Electronically Signed   By: Jeronimo Greaves M.D.   On: 04/19/2023 16:21    Procedures Procedures  Medications Ordered in ED Medications - No data to display  ED Course/ Medical Decision Making/ A&P Clinical Course as of 04/19/23 1849  Sun Apr 19, 2023  1724 Abdominal film without signs of obstruction or significant stool burden [JK]  1824 CBC normal [JK]  1824 Lipase slightly elevated 67 but I doubt significant clinically significant.  Metabolic panel shows bicarb decreased at 17.  No signs of acute kidney injury. [JK]    Clinical Course User Index [JK] Linwood Dibbles, MD                                 Medical Decision Making Problems Addressed: Constipation, unspecified constipation type: acute illness or injury that poses a threat to life or bodily functions  Amount and/or Complexity of Data Reviewed Labs: ordered. Decision-making details documented in ED Course. Radiology: ordered and independent interpretation performed.  Risk OTC drugs. Prescription drug management.   Patient presented to the ED for evaluation of constipation ongoing for several months.  Patient states she has to take laxatives in order to feel like she is having a normal bowel movement.  Patient's not had any vomiting or diarrhea.  No fevers.  Patient's abdominal exam is benign.  No leukocytosis.  With the chronicity of her symptoms I have low suspicion for colitis diverticulitis.  It is possible symptoms may be related to constipation versus IBS.  A colonic mass is also a consideration.  I do think she would benefit from outpatient GI consultation and possible colonoscopy.  Patient also mentions been having some trouble with arm pain.  Patient had been previously diagnosed with a cervical radiculopathy.  She has seen orthopedics.  Patient's not having any acute issues with that.  I do recommend she  follow-up with her orthopedic doctor        Final Clinical Impression(s) / ED Diagnoses Final diagnoses:  Constipation, unspecified constipation type    Rx / DC Orders ED Discharge Orders          Ordered    polyethylene glycol (MIRALAX) 17 g packet  2 times daily        04/19/23 1845    dicyclomine (BENTYL) 20 MG tablet  2 times daily        04/19/23 1845              Linwood Dibbles, MD 04/19/23 1849

## 2023-04-19 NOTE — ED Triage Notes (Signed)
Reports right mid humerus pain that wakes her up during themiddle of the night and has been seeing a MD and has had shots but wants it evaluated.  Also reports unable to have BM  reports has not urge to push.  Patient is on ozempic.  LBM yesterday  but small.

## 2023-04-24 ENCOUNTER — Ambulatory Visit (INDEPENDENT_AMBULATORY_CARE_PROVIDER_SITE_OTHER)
Admission: RE | Admit: 2023-04-24 | Discharge: 2023-04-24 | Disposition: A | Discharge status: Home / Self Care | Payer: 59 | Source: Ambulatory Visit | Attending: Gastroenterology | Admitting: Gastroenterology

## 2023-04-24 ENCOUNTER — Encounter: Payer: Self-pay | Admitting: Gastroenterology

## 2023-04-24 ENCOUNTER — Ambulatory Visit (INDEPENDENT_AMBULATORY_CARE_PROVIDER_SITE_OTHER): Payer: 59 | Admitting: Gastroenterology

## 2023-04-24 VITALS — BP 170/110 | HR 97 | Ht 60.0 in | Wt 181.0 lb

## 2023-04-24 DIAGNOSIS — Z860100 Personal history of colon polyps, unspecified: Secondary | ICD-10-CM | POA: Diagnosis not present

## 2023-04-24 DIAGNOSIS — K5909 Other constipation: Secondary | ICD-10-CM

## 2023-04-24 DIAGNOSIS — K219 Gastro-esophageal reflux disease without esophagitis: Secondary | ICD-10-CM | POA: Diagnosis not present

## 2023-04-24 MED ORDER — NA SULFATE-K SULFATE-MG SULF 17.5-3.13-1.6 GM/177ML PO SOLN: 1.0000 | Freq: Once | ORAL | 0 refills | Status: AC

## 2023-04-24 NOTE — Progress Notes (Signed)
04/24/2023 Sherry Cook [id number] 05-Dec-1960   HISTORY OF PRESENT ILLNESS: This is a 62 year old female who is a patient of Dr. Lanetta Inch.  She saw him earlier this year and scheduled EGD and colonoscopy.  Has a history of colon polyps reportedly on last colonoscopy 2016 and says that she was told she needed a repeat in 5 years.  Also had reported some reflux.  There was apparently some issues with insurance approval and the procedures so she did not proceed.  She is here today with her daughter.  She tells me that she has had longstanding/lifelong issues with constipation and that more recently had been on Ozempic (has not been on it for about 2 weeks now).  She says that her last bowel movement was 2 weeks ago and was not a large amount.  She has tried fleets enemas, MiraLAX, magnesium citrate, but nothing has worked.  She was in the emergency room 5 days ago.  She says that she has a lot of pressure in her pelvis and her bottom, but she cannot move her bowels.  Says that she feels like she physically cannot push the stool out.  Past Medical History:  Diagnosis Date   Allergic rhinitis    Depression    Diabetes mellitus without complication (HCC)    GERD (gastroesophageal reflux disease)    History of migraine headaches    Hyperlipidemia    Hypertension    Past Surgical History:  Procedure Laterality Date   ABDOMINAL HYSTERECTOMY  10/2004   partial   ECTOPIC PREGNANCY SURGERY     TUBAL LIGATION      reports that she has never smoked. She has never used smokeless tobacco. She reports that she does not drink alcohol and does not use drugs. family history includes Asthma in her father; Breast cancer in her sister; Diabetes in her brother and sister; Hypertension in her brother and sister; Parkinson's disease in her mother. Allergies  Allergen Reactions   Ibuprofen Hives and Shortness Of Breath    Orange coated only, pt tolerates OTC and dye free ibuprofen   Latex      Other Reaction(s) from [employer] [facility]: Rash, Itching   Peanut-Containing Drug Products       Outpatient Encounter Medications as of 04/24/2023  Medication Sig   acetaminophen (TYLENOL) 500 MG tablet Take 2 tablets (1,000 mg total) by mouth every 8 (eight) hours as needed for up to 30 doses for mild pain or fever.   amLODipine (NORVASC) 10 MG tablet Take 10 mg by mouth daily.   baclofen (LIORESAL) 10 MG tablet Take 1 tablet (10 mg total) by mouth at bedtime.   carvedilol (COREG) 25 MG tablet Take 1 tablet (25 mg total) by mouth 2 (two) times daily with a meal.   cetirizine (ZYRTEC) 10 MG tablet Take 10 mg by mouth at bedtime as needed for allergies.   diclofenac Sodium (VOLTAREN) 1 % GEL Apply 4 g topically 4 (four) times daily. Apply to affected areas 4 times daily as needed for pain.   esomeprazole (NEXIUM) 40 MG capsule Take 40 mg by mouth daily.   FLUoxetine (PROZAC) 20 MG capsule Take 20 mg by mouth daily.   losartan-hydrochlorothiazide (HYZAAR) 50-12.5 MG tablet Take 1 tablet by mouth daily.   methylPREDNISolone (MEDROL DOSEPAK) 4 MG TBPK tablet Take 24 mg on day 1, 20 mg on day 2, 16 mg on day 3, 12 mg on day 4, 8 mg on day 5, 4 mg on  day 6.  Take all tablets in each row at once, do not spread tablets out throughout the day.   polyethylene glycol (MIRALAX) 17 g packet Take 17 g by mouth 2 (two) times daily.   Sodium Sulfate-Mag Sulfate-KCl (SUTAB) [phone] MG TABS Use as directed for colonoscopy. MANUFACTURER CODES!! BIN: [id number] PCN: CN GROUP: [id number] MEMBER ID: [id number];BMW AS SECONDARY INSURANCE ;NO PRIOR AUTHORIZATION   topiramate (TOPAMAX) 50 MG tablet Take 50 mg by mouth daily.   dicyclomine (BENTYL) 20 MG tablet Take 1 tablet (20 mg total) by mouth 2 (two) times daily. (Patient not taking: Reported on 04/24/2023)   No facility-administered encounter medications on file as of 04/24/2023.     REVIEW OF SYSTEMS  : All other systems reviewed and negative except where noted  in the History of Present Illness.   PHYSICAL EXAM: BP (!) 170/110   Pulse 97   Ht 5' (1.524 m)   Wt 181 lb (82.1 kg)   BMI 35.35 kg/m  General: Well developed female in no acute distress Head: Normocephalic and atraumatic Eyes:  Sclerae anicteric, conjunctiva pink. Ears: Normal auditory acuity Lungs: Clear throughout to auscultation; no W/R/R. Heart: Regular rate and rhythm; no M/R/G. Abdomen: Soft, non-distended.  BS present.  Some suprapubic TTP. Musculoskeletal: Symmetrical with no gross deformities  Skin: No lesions on visible extremities Extremities: No edema  Neurological: Alert oriented x 4, grossly non-focal Psychological:  Alert and cooperative. Normal mood and affect  ASSESSMENT AND PLAN: *Chronic constipation: Says she has not had any bowel movement in 2 weeks.  Has taken MiraLAX, magnesium citrate, fleets enema.  Says that she has a lot of pressure in her bottom and pelvis, but cannot have a bowel movement.  She has had lifelong issues with constipation and likely her Ozempic has worsened that.  She has not been on the Ozempic now for a couple of weeks and she should continue to hold that.  We are going to give her a sample of a bowel prep to take this evening.  Will then also give her some samples of Linzess 290 mcg daily to start tomorrow.  We can titrate that down over time if we need to.  Will check an abdominal x-ray today as well.  May need to consider anorectal manometry/or pelvic floor physical therapy.  ? Sitz marker study particularly if minimal response to meds. *Personal history of colon polyps: Says that last colonoscopy was in 2016 with polyps removed and was told she needed a repeat in 5 years.  Will schedule Dr. Adela Lank.  Had plan for this with him earlier this year but was not able to proceed.  Will need a 2 day bowel prep. *GERD: Had also planned for EGD earlier this year with Dr. Adela Lank, but was not able to proceed at that time.  Will reschedule EGD as  well.  Continue Nexium 40 mg daily.  **The risks, benefits, and alternatives to EGD and colonoscopy were discussed with the patient and she consents to proceed.   CC:  Fleet Contras, MD

## 2023-04-24 NOTE — Patient Instructions (Addendum)
Your provider has requested that you have an abdominal x ray before leaving today. Please go to the basement floor to our Radiology department for the test.  We have given you samples of the following medication to take: Linzess 290 mcg daily before breakfast, starting tomorrow.   Complete bowel purge per box instructions today.   You have been scheduled for an endoscopy and colonoscopy. Please follow the written instructions given to you at your visit today.  Please pick up your prep supplies at the pharmacy within the next 1-3 days.  If you use inhalers (even only as needed), please bring them with you on the day of your procedure.  DO NOT TAKE 7 DAYS PRIOR TO TEST- Trulicity (dulaglutide) Ozempic, Wegovy (semaglutide) Mounjaro (tirzepatide) Bydureon Bcise (exanatide extended release)  DO NOT TAKE 1 DAY PRIOR TO YOUR TEST Rybelsus (semaglutide) Adlyxin (lixisenatide) Victoza (liraglutide) Byetta (exanatide) ______________________________________________________________  _______________________________________________________  If your blood pressure at your visit was 140/90 or greater, please contact your primary care physician to follow up on this.  _______________________________________________________  If you are age 34 or older, your body mass index should be between 23-30. Your Body mass index is 35.35 kg/m. If this is out of the aforementioned range listed, please consider follow up with your Primary Care Provider.  If you are age 67 or younger, your body mass index should be between 19-25. Your Body mass index is 35.35 kg/m. If this is out of the aformentioned range listed, please consider follow up with your Primary Care Provider.   ________________________________________________________  The [place] [facility] providers would like to encourage you to use [facility] to communicate with providers for non-urgent requests or questions.  Due to long hold times on the telephone,  sending your provider a message by [facility] may be a faster and more efficient way to get a response.  Please allow 48 business hours for a response.  Please remember that this is for non-urgent requests.  _______________________________________________________

## 2023-04-24 NOTE — Progress Notes (Signed)
Agree with assessment and plan as outlined.  

## 2023-04-27 ENCOUNTER — Telehealth: Payer: Self-pay | Admitting: Gastroenterology

## 2023-04-27 NOTE — Telephone Encounter (Signed)
The pt called to update on the bowel purge. She states she had great results and will call back in 12 days to update on the Linzess.

## 2023-04-27 NOTE — Telephone Encounter (Signed)
PT is calling to discuss how her new medications are helping her. Please advise.

## 2023-05-19 ENCOUNTER — Encounter: Payer: Self-pay | Admitting: Gastroenterology

## 2023-05-29 ENCOUNTER — Encounter: Payer: Self-pay | Admitting: Gastroenterology

## 2023-05-29 ENCOUNTER — Ambulatory Visit: Payer: No Typology Code available for payment source | Admitting: Gastroenterology

## 2023-05-29 VITALS — BP 146/90 | HR 75 | Temp 97.7°F | Resp 10 | Ht 60.0 in | Wt 181.0 lb

## 2023-05-29 DIAGNOSIS — Z860100 Personal history of colon polyps, unspecified: Secondary | ICD-10-CM

## 2023-05-29 DIAGNOSIS — Z1211 Encounter for screening for malignant neoplasm of colon: Secondary | ICD-10-CM | POA: Diagnosis present

## 2023-05-29 DIAGNOSIS — K5730 Diverticulosis of large intestine without perforation or abscess without bleeding: Secondary | ICD-10-CM | POA: Diagnosis not present

## 2023-05-29 DIAGNOSIS — K449 Diaphragmatic hernia without obstruction or gangrene: Secondary | ICD-10-CM | POA: Diagnosis not present

## 2023-05-29 DIAGNOSIS — K648 Other hemorrhoids: Secondary | ICD-10-CM

## 2023-05-29 DIAGNOSIS — Q439 Congenital malformation of intestine, unspecified: Secondary | ICD-10-CM

## 2023-05-29 DIAGNOSIS — K219 Gastro-esophageal reflux disease without esophagitis: Secondary | ICD-10-CM | POA: Diagnosis not present

## 2023-05-29 MED ORDER — SODIUM CHLORIDE 0.9 % IV SOLN
500.0000 mL | Freq: Once | INTRAVENOUS | Status: DC
Start: 2023-05-29 — End: 2023-05-29

## 2023-05-29 NOTE — Progress Notes (Signed)
[facility] History and Physical   Primary Care Physician:  Fleet Contras, MD   Reason for Procedure:   History of colon  polyps, GERD  Plan:    Colonoscopy and EGD     HPI: Sherry Cook is a 62 y.o. female  here for EGD and colonoscopy to evaluate issues as outlined. Last colonoscopy reportedly 2016 with polyps removed.  Longstanding GERD, on nexium 40mg  / day longstanding, has had some breakthrough especially at night.    No family history of colon cancer known. Otherwise feels well without any cardiopulmonary symptoms.   I have discussed risks / benefits of anesthesia and endoscopic procedure with Dennard Schaumann and they wish to proceed with the exams as outlined today.    Past Medical History:  Diagnosis Date   Allergic rhinitis    Depression    Diabetes mellitus without complication (HCC)    GERD (gastroesophageal reflux disease)    History of migraine headaches    Hyperlipidemia    Hypertension     Past Surgical History:  Procedure Laterality Date   ABDOMINAL HYSTERECTOMY  10/2004   partial   ECTOPIC PREGNANCY SURGERY     TUBAL LIGATION      Prior to Admission medications   Medication Sig Start Date End Date Taking? Authorizing Provider  acetaminophen (TYLENOL) 500 MG tablet Take 2 tablets (1,000 mg total) by mouth every 8 (eight) hours as needed for up to 30 doses for mild pain or fever. 11/23/22   Theadora Rama Scales, PA-C  amLODipine (NORVASC) 10 MG tablet Take 10 mg by mouth daily.    [provider]  baclofen (LIORESAL) 10 MG tablet Take 1 tablet (10 mg total) by mouth at bedtime. 11/18/22   Zeph Riebel, Willaim Rayas, MD  carvedilol (COREG) 25 MG tablet Take 1 tablet (25 mg total) by mouth 2 (two) times daily with a meal. 04/04/15   Ozella Rocks, MD  cetirizine (ZYRTEC) 10 MG tablet Take 10 mg by mouth at bedtime as needed for allergies.    [provider]  diclofenac Sodium (VOLTAREN) 1 % GEL Apply 4 g topically 4  (four) times daily. Apply to affected areas 4 times daily as needed for pain. 11/23/22   Theadora Rama Scales, PA-C  esomeprazole (NEXIUM) 40 MG capsule Take 40 mg by mouth daily. 08/04/22   [provider]  FLUoxetine (PROZAC) 20 MG capsule Take 20 mg by mouth daily.    [provider]  losartan-hydrochlorothiazide (HYZAAR) 50-12.5 MG tablet Take 1 tablet by mouth daily. 08/18/22   Rheanne Cortopassi, Willaim Rayas, MD  metFORMIN (GLUCOPHAGE-XR) 500 MG 24 hr tablet Take 1 tablet by mouth 2 (two) times daily. 07/09/21   [provider]  polyethylene glycol (MIRALAX) 17 g packet Take 17 g by mouth 2 (two) times daily. 04/19/23   Linwood Dibbles, MD  topiramate (TOPAMAX) 50 MG tablet Take 50 mg by mouth daily. 08/04/22   [provider]    Current Outpatient Medications  Medication Sig Dispense Refill   acetaminophen (TYLENOL) 500 MG tablet Take 2 tablets (1,000 mg total) by mouth every 8 (eight) hours as needed for up to 30 doses for mild pain or fever. 60 tablet 0   amLODipine (NORVASC) 10 MG tablet Take 10 mg by mouth daily.     baclofen (LIORESAL) 10 MG tablet Take 1 tablet (10 mg total) by mouth at bedtime. 30 each 1   carvedilol (COREG) 25 MG tablet Take 1 tablet (25 mg total) by  mouth 2 (two) times daily with a meal. 60 tablet 1   cetirizine (ZYRTEC) 10 MG tablet Take 10 mg by mouth at bedtime as needed for allergies.     diclofenac Sodium (VOLTAREN) 1 % GEL Apply 4 g topically 4 (four) times daily. Apply to affected areas 4 times daily as needed for pain. 100 g 2   esomeprazole (NEXIUM) 40 MG capsule Take 40 mg by mouth daily.     FLUoxetine (PROZAC) 20 MG capsule Take 20 mg by mouth daily.     losartan-hydrochlorothiazide (HYZAAR) 50-12.5 MG tablet Take 1 tablet by mouth daily.     metFORMIN (GLUCOPHAGE-XR) 500 MG 24 hr tablet Take 1 tablet by mouth 2 (two) times daily.     polyethylene glycol (MIRALAX) 17 g packet Take 17 g by mouth 2 (two) times daily. 14 each 0    topiramate (TOPAMAX) 50 MG tablet Take 50 mg by mouth daily.     Current Facility-Administered Medications  Medication Dose Route Frequency Provider Last Rate Last Admin   0.9 %  sodium chloride infusion  500 mL Intravenous Once Donicia Druck, Willaim Rayas, MD        Allergies as of 05/29/2023 - Review Complete 05/29/2023  Allergen Reaction Noted   Ibuprofen Hives and Shortness Of Breath 04/04/2015   Latex  04/19/2023   Peanut-containing drug products  04/19/2023    Family History  Problem Relation Age of Onset   Parkinson's disease Mother    Asthma Father    Diabetes Sister    Hypertension Sister    Breast cancer Sister    Diabetes Brother    Hypertension Brother     Social History   Socioeconomic History   Marital status: Married    Spouse name: Not on file   Number of children: 2   Years of education: Not on file   Highest education level: Some college, no degree  Occupational History   Not on file  Tobacco Use   Smoking status: Never   Smokeless tobacco: Never  Vaping Use   Vaping status: Never Used  Substance and Sexual Activity   Alcohol use: No   Drug use: No   Sexual activity: Not Currently    Birth control/protection: Surgical  Other Topics Concern   Not on file  Social History Narrative   Not on file   Social Drivers of Health   Financial Resource Strain: Not on File (09/26/2021)   Received from [employer], [employer]    Financial Resource Strain: 0  Food Insecurity: Not on File (03/05/2023)   Received from [facility] Insecurity    Food: 0  Transportation Needs: Not on File (09/26/2021)   Received from [employer], [employer] Needs    Transportation: 0  Physical Activity: Not on File (09/26/2021)   Received from [place], [place]   Physical Activity    Physical Activity: 0  Stress: Not on File (09/26/2021)   Received from [facility], [place]   Stress    Stress: 0  Social Connections: Not on File (02/21/2023)   Received from [employer]    Social Connections    Connectedness: 0  Intimate Partner Violence: Not on file    Review of Systems: All other review of systems negative except as mentioned in the HPI.  Physical Exam: Vital signs BP (!) 157/106   Pulse 89   Temp 97.7 F (36.5 C)   Ht 5' (1.524 m)   Wt 181 lb (82.1 kg)  SpO2 98%   BMI 35.35 kg/m   General:   Alert,  Well-developed, pleasant and cooperative in NAD Lungs:  Clear throughout to auscultation.   Heart:  Regular rate and rhythm Abdomen:  Soft, nontender and nondistended.   Neuro/Psych:  Alert and cooperative. Normal mood and affect. A and O x 3  Harlin Rain, MD [facility]

## 2023-05-29 NOTE — Op Note (Signed)
[facility] Patient Name: Sherry Cook Procedure Date: 05/29/2023 2:41 PM MRN: [id number] Endoscopist: Viviann Spare P. Adela Lank , MD, [id number] Age: 62 Referring MD:  Date of Birth: 1961/04/04 Gender: Female Account #: [id number] Procedure:                Upper GI endoscopy Indications:              history of gastro-esophageal reflux disease - on                            nexium once daily with some breakthrough, nocturnal                            symptoms Medicines:                Monitored Anesthesia Care Procedure:                Pre-Anesthesia Assessment:                           - Prior to the procedure, a History and Physical                            was performed, and patient medications and                            allergies were reviewed. The patient's tolerance of                            previous anesthesia was also reviewed. The risks                            and benefits of the procedure and the sedation                            options and risks were discussed with the patient.                            All questions were answered, and informed consent                            was obtained. Prior Anticoagulants: The patient has                            taken no anticoagulant or antiplatelet agents. ASA                            Grade Assessment: II - A patient with mild systemic                            disease. After reviewing the risks and benefits,                            the patient was deemed in satisfactory condition to  undergo the procedure.                           After obtaining informed consent, the endoscope was                            passed under direct vision. Throughout the                            procedure, the patient's blood pressure, pulse, and                            oxygen saturations were monitored continuously. The                            GIF [id number] was introduced  through the                            mouth, and advanced to the second part of duodenum.                            The upper GI endoscopy was accomplished without                            difficulty. The patient tolerated the procedure                            well. Scope In: Scope Out: Findings:                 Esophagogastric landmarks were identified: the                            Z-line was found at 34 cm, the gastroesophageal                            junction was found at 34 cm and the upper extent of                            the gastric folds was found at 35 cm from the                            incisors.                           The gastroesophageal flap valve was visualized                            endoscopically and classified as Hill Grade II                            (fold present, opens with respiration).                           A 1 cm hiatal hernia was present.  The exam of the esophagus was otherwise normal. No                            Barrett's or erosive changes.                           The entire examined stomach was normal.                           The examined duodenum was normal. Complications:            No immediate complications. Estimated blood loss:                            None. Estimated Blood Loss:     Estimated blood loss: none. Impression:               - Esophagogastric landmarks identified.                           - Gastroesophageal flap valve classified as Hill                            Grade II (fold present, opens with respiration).                           - 1 cm hiatal hernia.                           - Normal esophagus otherwise.                           - Normal stomach.                           - Normal examined duodenum.                           No erosive changes, patient is probably having                            nonacid reflux. Based on how much this bothers her,                             could consider TIF to better control symptoms                            however would need to lower BMI some with weight                            loss. Can discuss options with her. Recommendation:           - Patient has a contact number available for                            emergencies. The signs and symptoms of potential  delayed complications were discussed with the                            patient. Return to normal activities tomorrow.                            Written discharge instructions were provided to the                            patient.                           - Resume previous diet.                           - Continue present medications.                           - Trial of nexium twice daily to see if that can                            get better control of symptoms. Viviann Spare P. Advika Mclelland, MD 05/29/2023 4:04:09 PM This report has been signed electronically.

## 2023-05-29 NOTE — Patient Instructions (Addendum)
Recommendation:           - Patient has a contact number available for                            emergencies. The signs and symptoms of potential                            delayed complications were discussed with the                            patient. Return to normal activities tomorrow.                            Written discharge instructions were provided to the                            patient.                           - Resume previous diet.                           - Continue present medications.                           - Trial of nexium twice daily to see if that can                            get better control of symptoms. Recommendation:           - Patient has a contact number available for                            emergencies. The signs and symptoms of potential                            delayed complications were discussed with the                            patient. Return to normal activities tomorrow.                            Written discharge instructions were provided to the                            patient.                           - Resume previous diet.                           - Continue present medications.                           - Would like to obtain report from last colonoscopy  2016 to determine polyp burden and assess if repeat                            colonoscopy is needed sooner than 10 years. If no                            high risk lesions on that exam, recommend repeat                            exam in 10 years.   YOU HAD AN ENDOSCOPIC PROCEDURE TODAY AT THE  ENDOSCOPY CENTER:   Refer to the procedure report that was given to you for any specific questions about what was found during the examination.  If the procedure report does not answer your questions, please call your gastroenterologist to clarify.  If you requested that your care partner not be given the details of your procedure findings,  then the procedure report has been included in a sealed envelope for you to review at your convenience later.  YOU SHOULD EXPECT: Some feelings of bloating in the abdomen. Passage of more gas than usual.  Walking can help get rid of the air that was put into your GI tract during the procedure and reduce the bloating. If you had a lower endoscopy (such as a colonoscopy or flexible sigmoidoscopy) you may notice spotting of blood in your stool or on the toilet paper. If you underwent a bowel prep for your procedure, you may not have a normal bowel movement for a few days.  Please Note:  You might notice some irritation and congestion in your nose or some drainage.  This is from the oxygen used during your procedure.  There is no need for concern and it should clear up in a day or so.  SYMPTOMS TO REPORT IMMEDIATELY:  Following lower endoscopy (colonoscopy or flexible sigmoidoscopy):  Excessive amounts of blood in the stool  Significant tenderness or worsening of abdominal pains  Swelling of the abdomen that is new, acute  Fever of 100F or higher  Following upper endoscopy (EGD)  Vomiting of blood or coffee ground material  New chest pain or pain under the shoulder blades  Painful or persistently difficult swallowing  New shortness of breath  Fever of 100F or higher  Black, tarry-looking stools  For urgent or emergent issues, a gastroenterologist can be reached at any hour by calling [phone]. Do not use MyChart messaging for urgent concerns.    DIET:  We do recommend a small meal at first, but then you may proceed to your regular diet.  Drink plenty of fluids but you should avoid alcoholic beverages for 24 hours.  ACTIVITY:  You should plan to take it easy for the rest of today and you should NOT DRIVE or use heavy machinery until tomorrow (because of the sedation medicines used during the test).    FOLLOW UP: Our staff will call the number listed on your records the next business  day following your procedure.  We will call around 7:15- 8:00 am to check on you and address any questions or concerns that you may have regarding the information given to you following your procedure. If we do not reach you, we will leave a message.     If any biopsies were taken you will be contacted by phone or by letter  within the next 1-3 weeks.  Please call us at [phone] if you have not heard about the biopsies in 3 weeks.    SIGNATURES/CONFIDENTIALITY: You and/or your care partner have signed paperwork which will be entered into your electronic medical record.  These signatures attest to the fact that that the information above on your After Visit Summary has been reviewed and is understood.  Full responsibility of the confidentiality of this discharge information lies with you and/or your care-partner.

## 2023-05-29 NOTE — Op Note (Signed)
[facility] Patient Name: Sherry Cook Procedure Date: 05/29/2023 2:35 PM MRN: [id number] Endoscopist: Viviann Spare P. Adela Lank , MD, [id number] Age: 62 Referring MD:  Date of Birth: Jul 30, 1960 Gender: Female Account #: [id number] Procedure:                Colonoscopy Indications:              High risk colon cancer surveillance: Personal                            history of colonic polyps - reported history of                            polyps in 2016 - no report available. Medicines:                Monitored Anesthesia Care Procedure:                Pre-Anesthesia Assessment:                           - Prior to the procedure, a History and Physical                            was performed, and patient medications and                            allergies were reviewed. The patient's tolerance of                            previous anesthesia was also reviewed. The risks                            and benefits of the procedure and the sedation                            options and risks were discussed with the patient.                            All questions were answered, and informed consent                            was obtained. Prior Anticoagulants: The patient has                            taken no anticoagulant or antiplatelet agents. ASA                            Grade Assessment: II - A patient with mild systemic                            disease. After reviewing the risks and benefits,                            the patient was deemed in satisfactory condition to  undergo the procedure.                           After obtaining informed consent, the colonoscope                            was passed under direct vision. Throughout the                            procedure, the patient's blood pressure, pulse, and                            oxygen saturations were monitored continuously. The                            Olympus Scope  [id number] was introduced through the                            anus and advanced to the the cecum, identified by                            appendiceal orifice and ileocecal valve. The                            colonoscopy was performed without difficulty. The                            patient tolerated the procedure well. The quality                            of the bowel preparation was adequate. The                            ileocecal valve, appendiceal orifice, and rectum                            were photographed. Scope In: 3:32:52 PM Scope Out: 3:52:52 PM Scope Withdrawal Time: 0 hours 15 minutes 40 seconds  Total Procedure Duration: 0 hours 20 minutes 0 seconds  Findings:                 The perianal and digital rectal examinations were                            normal.                           Multiple small-mouthed diverticula were found in                            the transverse colon and ascending colon.                           The colon was tortuous.  Internal hemorrhoids were found during retroflexion.                           The exam was otherwise without abnormality. Prep                            was adequate however several minutes spent lavaging                            the cecum and right colon to achieve adequate                            views. The left colon did not retain well. Complications:            No immediate complications. Estimated blood loss:                            None. Estimated Blood Loss:     Estimated blood loss: none. Impression:               - Diverticulosis in the transverse colon and in the                            ascending colon.                           - Tortuous colon.                           - Internal hemorrhoids.                           - The examination was otherwise normal.                           - No polyps. Recommendation:           - Patient has a contact number available  for                            emergencies. The signs and symptoms of potential                            delayed complications were discussed with the                            patient. Return to normal activities tomorrow.                            Written discharge instructions were provided to the                            patient.                           - Resume previous diet.                           -  Continue present medications.                           - Would like to obtain report from last colonoscopy                            2016 to determine polyp burden and assess if repeat                            colonoscopy is needed sooner than 10 years. If no                            high risk lesions on that exam, recommend repeat                            exam in 10 years. Viviann Spare P. Danyale Ridinger, MD 05/29/2023 3:59:48 PM This report has been signed electronically.

## 2023-05-29 NOTE — Progress Notes (Signed)
Sedate, gd SR, tolerated procedure well, VSS, report to RN 

## 2023-06-01 ENCOUNTER — Telehealth: Payer: Self-pay

## 2023-06-01 NOTE — Telephone Encounter (Signed)
  Follow up Call-     05/29/2023    2:43 PM  Call back number  Post procedure Call Back phone  # [phone]  Permission to leave phone message Yes     Patient questions:  Do you have a fever, pain , or abdominal swelling? No. Pain Score  0 *  Have you tolerated food without any problems? Yes.    Have you been able to return to your normal activities? Yes.    Do you have any questions about your discharge instructions: Diet   No. Medications  No. Follow up visit  No.  Do you have questions or concerns about your Care? No.  Actions: * If pain score is 4 or above: No action needed, pain <4.

## 2023-06-17 ENCOUNTER — Telehealth: Payer: Self-pay

## 2023-06-17 NOTE — Telephone Encounter (Signed)
 Called and spoke to patient. She had a procedure with Dr. Adela Lank on 12-20 and he had wanted to get a copy of her last colonoscopy in 2016. Patient indicated she forgot but she thinks she has it.  She till try to find it and get [place] a copy.

## 2023-08-07 ENCOUNTER — Other Ambulatory Visit: Payer: Self-pay | Admitting: Internal Medicine

## 2023-08-07 DIAGNOSIS — Z1231 Encounter for screening mammogram for malignant neoplasm of breast: Secondary | ICD-10-CM

## 2023-08-28 ENCOUNTER — Ambulatory Visit
Admission: RE | Admit: 2023-08-28 | Discharge: 2023-08-28 | Disposition: A | Discharge status: Home / Self Care | Payer: No Typology Code available for payment source | Source: Ambulatory Visit | Attending: Internal Medicine | Admitting: Internal Medicine

## 2023-08-28 DIAGNOSIS — Z1231 Encounter for screening mammogram for malignant neoplasm of breast: Secondary | ICD-10-CM

## 2024-09-12 ENCOUNTER — Ambulatory Visit: Admitting: Neurology
